# Patient Record
Sex: Female | Born: 1975 | Race: White | Hispanic: No | Marital: Married | Smoking: Former smoker
Health system: Western US, Academic
[De-identification: ages and names within clinical notes are randomized; demographics above are authoritative.]

## PROBLEM LIST (undated history)

## (undated) DIAGNOSIS — F32A Depression, unspecified: Secondary | ICD-10-CM

## (undated) DIAGNOSIS — F329 Major depressive disorder, single episode, unspecified: Secondary | ICD-10-CM

## (undated) DIAGNOSIS — G43909 Migraine, unspecified, not intractable, without status migrainosus: Secondary | ICD-10-CM

## (undated) DIAGNOSIS — O24419 Gestational diabetes mellitus in pregnancy, unspecified control: Secondary | ICD-10-CM

## (undated) HISTORY — DX: Major depressive disorder, single episode, unspecified: F32.9

## (undated) HISTORY — DX: Migraine, unspecified, not intractable, without status migrainosus: G43.909

## (undated) HISTORY — DX: Gestational diabetes mellitus in pregnancy, unspecified control: O24.419

## (undated) HISTORY — DX: Depression, unspecified: F32.A

## (undated) HISTORY — PX: EYE SURGERY: SHX253

---

## 2001-07-15 ENCOUNTER — Other Ambulatory Visit (INDEPENDENT_AMBULATORY_CARE_PROVIDER_SITE_OTHER): Payer: Self-pay | Admitting: Internal Medicine

## 2008-10-04 ENCOUNTER — Ambulatory Visit (HOSPITAL_BASED_OUTPATIENT_CLINIC_OR_DEPARTMENT_OTHER)

## 2008-10-04 ENCOUNTER — Encounter (HOSPITAL_BASED_OUTPATIENT_CLINIC_OR_DEPARTMENT_OTHER): Payer: Self-pay

## 2008-10-04 VITALS — Ht 65.0 in | Wt 223.0 lb

## 2008-10-04 MED ORDER — ONETOUCH ULTRASOFT LANCETS MISC
Status: DC
Start: 2008-10-04 — End: 2015-05-02

## 2008-10-04 MED ORDER — GLUCOSE BLOOD VI STRP
ORAL_STRIP | Freq: Four times a day (QID) | Status: DC
Start: 2008-10-04 — End: 2015-05-02

## 2008-10-04 MED ORDER — ONETOUCH ULTRA SYSTEM W/DEVICE KIT (CUSTOM)
1.0000 | PACK | Status: DC
Start: 2008-10-04 — End: 2015-05-02

## 2008-10-04 NOTE — Progress Notes (Signed)
 Autumn Parker is a 33 year old female G2P1001 at gestational age [redacted]w[redacted]d Estimated Date of Delivery: 12/10/08    Patient presents today for diabetes education referred from Dr Peggyann Juba.  Autumn Parker with history of New Dx GDM.     Assessment / Plan:    1. Diabetes: Autumn Parker was given overview of diabetes in pregnancy, causes, risks to mom and fetus, lifetime risks, treatment and goals. Given Sweet Success packet, 1800-2000 calorie GDM diet, exercise guidelines, food diary and blood sugar logbook.  Autumn Parker was given blood glucose meter with instruction and demonstration.  Return demonstration performed by patient.  Blood sugar result = 88 mg/dl. Patient given blood sugar goals and instructed to test blood sugars at fasting and 1 hour post breakfast, lunch and dinner and record in logbook.  Autumn Parker was instructed on when to report abnormal values and instructed to email/fax blood sugars weekly for review. RX for test strips and lancets given per [facility].     2. Routine OB/Fetus: Follow-up 1 week with RN for blood sugar review.    3. Education: Education: diabetes overview, pregnancy overview, maternal risks, fetal risks and breastfeeding  Nutrition: pregnancy nutrition, desired weight gain, meal distribution /  timing, meal composition, prenatal vitamins, labels, simple sugars and sweeteners/caffeine  Exercise:  type and frequency, timing and duration and complications: when / who to call  Blood glucose / urine monitoring:  blood glucose goals, meter instruction/return demo, testing frequency and how/when to report values  Psychosocial:  assessment performed-Edinburgh Depression scale assessed today-  Score 6    Fetal Surveillance: ultrasound, kick count method / record and NST/CST  Medication: type / action and dose / administration  Postpartum f/u:  2 hr PP GTT rationale/Risk Type 2, weight management, exercise and annual exam  Materials given:  sweet success  packet    Plan of care discussed with patient and patient verbalized understanding: yes.   All questions were answered at the end of the class.    Patient education session lasted 3 hr    Autumn Canter Qualin-Chappell, RN, CDE

## 2008-10-12 ENCOUNTER — Telehealth (HOSPITAL_BASED_OUTPATIENT_CLINIC_OR_DEPARTMENT_OTHER): Payer: Self-pay | Admitting: Registered Nurse

## 2008-10-12 NOTE — Telephone Encounter (Signed)
 Normal ranges: Fasting <90 Post meal <130  Times of Day Glucose Tested & Results    Aug 4 5 6 7 8 9 10    Fasting  (70-90)  82 83 82 89 90 80   After Bkfast  (90-130)  82 104 126 122 139 98   After Lunch  (90-130) 109 122 110 124 100 156 139   After Dinner  (90-130) 120 112 136 139 85 128 94   Bedtime  (90-130)          Early AM   (0200-0300)

## 2008-10-13 NOTE — Telephone Encounter (Signed)
 Amalee is a 33 year old female G2P1001 at gestational age [redacted]w[redacted]d Estimated Date of Delivery: 12/10/08    Comanaged with Dr Fritzi Mandes [facility]  Weight  223      Diabetes type:GDMa1    Chief Complaint   Patient presents with   . Diabetes     blood sugar review       Times of Day Glucose Tested & Results    Aug 4 5 6 7 8 9 10    Fasting  (70-90)  82 83 82 89 90 80   After Bkfast  (90-130)  82 104 126 122 139 bagel 98   After Lunch  (90-130) 109 122 110 124 100 156  Club  sam 139   After Dinner  (90-130) 120 112 136 b-day 139  sushi 85 128 94   Bedtime  (90-130)          Early AM   (0200-0300)            Assessment / Plan:    1. Diabetes: Doing well on diet control. Occasional sporadic elevation due to diet. Reinforced meal plan with pt and re-eval 1 week.     2. Routine OB/Fetus: Reports active fetus with FKC's <  20 minutes, U/S for Growth with PNA, symmetrically large and recommend repeat @ 37 weeks.   Will need NST/AFI @ 36 weeks.      3. Education discussed: Nutrition: meal distribution /  timing and meal composition  Exercise:  type and frequency, timing and duration and complications: when / who to call  Blood glucose / urine monitoring:  blood glucose goals, testing frequency and how/when to report values  Fetal Surveillance: ultrasound and kick count method / record    Plan of care discussed with patient and patient verbalized understanding: YES.  Kirkland Hun RN,CDE

## 2008-10-20 ENCOUNTER — Telehealth (HOSPITAL_BASED_OUTPATIENT_CLINIC_OR_DEPARTMENT_OTHER): Payer: Self-pay

## 2008-10-20 NOTE — Telephone Encounter (Signed)
 Autumn Parker is a 33 year old female G2P1001 at gestational age [redacted]w[redacted]d Estimated Date of Delivery: 12/10/08    Co-Manage Dr.Lee  Diabetes type:GDMa1  Weight 222    Chief Complaint   Patient presents with   . Diabetes     Review of blood sugars         Times of Day Glucose Tested & Results    Aug 11 12 13 14 15 16 17  Current Med Med Changes   Fasting 85 80 91 88 88 79 87 None None   After Bkfast  95 99 100 97 98 78     After Lunch 110 123 111   113 97 None None   After Dinner 89 128 114 97 89  110 None None   Bedtime        None None   Early AM   (0200-0300)              Assessment / Plan:    1. Diabetes: Pt doing well this week with blood sugars much improved after meals now that she has figured out diet better. Pt to continue with diet and blood sugar monitoring. Pt to send blood sugars again next week for review.    2. Routine OB/Fetus: Growth scan at [facility] @ 36 weeks, NST to start at 36 weeks, FKC's < 15 min    3. Education discussed: Nutrition: meal distribution /  timing and meal composition  Exercise:  type and frequency and timing and duration  Blood glucose / urine monitoring:  testing frequency and how/when to report values  Fetal Surveillance: ultrasound, kick count method / record and NST/CST    Plan of care discussed with patient and patient verbalized understanding: No, e-mailed pt plan.

## 2008-11-03 ENCOUNTER — Telehealth (HOSPITAL_BASED_OUTPATIENT_CLINIC_OR_DEPARTMENT_OTHER): Payer: Self-pay | Admitting: Registered Nurse

## 2008-11-03 NOTE — Telephone Encounter (Signed)
 Autumn Parker is a 33 year old female G2P1001 at gestational age [redacted]w[redacted]d Estimated Date of Delivery: 12/10/08    Comanaged with Dr Fritzi Mandes [facility]  Weight 223        Diabetes type:GDMa1    Chief Complaint   Patient presents with   . Diabetes     blood sugar review         Times of Day Glucose Tested & Results    Aug 25 26 27 28 29 30 31  Current Med Med Changes   Fasting 89 92 91 83 88 84 85 None None   After Bkfast 86 84 98 --- 113 85 115     After Lunch -- 101 124 --- --- 92 134 None None   After Dinner 120 138 spag 151  pizza 105 99 106 142 fruit tart None None   Bedtime        None None   Early AM   (0200-0300)              Assessment / Plan:    1. Diabetes: Doing  reasonablywell on diet control. Occasional sporadic elevation due to diet. Reinforced meal plan with pt and re-eval 1 week.    2. Routine OB/Fetus: Reports active fetus with FKC's <  15 minutes, U/S for Growth sched for 36 weeks at [facility].  Will need NST/AFI @ 36 weeks.      3. Education discussed: Nutrition: meal distribution /  timing, meal composition and simple sugars  Exercise:  type and frequency, timing and duration and complications: when / who to call  Blood glucose / urine monitoring:  blood glucose goals, testing frequency and how/when to report values  Fetal Surveillance: ultrasound, kick count method / record and NST/CST    Plan of care discussed with patient and patient verbalized understanding: YES.  Kirkland Hun RN,CDE

## 2008-11-10 ENCOUNTER — Telehealth (HOSPITAL_BASED_OUTPATIENT_CLINIC_OR_DEPARTMENT_OTHER): Payer: Self-pay | Admitting: Registered Nurse

## 2008-11-10 NOTE — Telephone Encounter (Signed)
 Autumn Parker is a 33 year old female G2P1001 at gestational age [redacted]w[redacted]d Estimated Date of Delivery: 12/10/08    Comanaged with Dr Fritzi Mandes [facility]  Weight 225.5        Diabetes type:GDMa1    Chief Complaint   Patient presents with   . Diabetes     blood sugar review         Times of Day Glucose Tested & Results    Sep  1 2 3 4 5 6 7  Current Med Med Changes   Fasting 81 78 86 87 86 83 78 None None   After Bkfast 121 111 101 102 --- 101 101     After Lunch --- 103 --- 134 125 128 131 None None   After Dinner 96 153 103 92 128 90 110 None None   Bedtime        None None   Early AM   (0200-0300)              Assessment / Plan:    1. Diabetes: Doing well on diet control. Continue current management. Review sugars 1 week. The patient was instructed to call PRN elevations or questions in the interim.        2. Routine OB/Fetus: Reports active fetus with FKC's <  15 minutes, U/S for Growth sched for 36 weeks at [facility].  Will need NST/AFI @ 36 weeks.      3. Education discussed: Nutrition: meal distribution /  timing and meal composition  Blood glucose / urine monitoring:  blood glucose goals, testing frequency and how/when to report values  Fetal Surveillance: ultrasound, kick count method / record and NST/CST    Plan of care discussed with patient and patient verbalized understanding: YES.  Kirkland Hun RN,CDE

## 2008-11-13 ENCOUNTER — Telehealth (HOSPITAL_BASED_OUTPATIENT_CLINIC_OR_DEPARTMENT_OTHER): Payer: Self-pay

## 2008-11-13 NOTE — Telephone Encounter (Deleted)
 Pt called and left VM stating that she had a question. Called her back at 11:20 am and left VM that we are returning her call.

## 2008-11-13 NOTE — Telephone Encounter (Signed)
 Encounter opened in error please disregard

## 2008-11-15 NOTE — Progress Notes (Signed)
GDM class

## 2008-11-17 ENCOUNTER — Telehealth (HOSPITAL_BASED_OUTPATIENT_CLINIC_OR_DEPARTMENT_OTHER): Payer: Self-pay | Admitting: Registered Nurse

## 2008-11-24 ENCOUNTER — Telehealth (HOSPITAL_BASED_OUTPATIENT_CLINIC_OR_DEPARTMENT_OTHER): Payer: Self-pay | Admitting: Registered Nurse

## 2010-11-08 ENCOUNTER — Encounter (INDEPENDENT_AMBULATORY_CARE_PROVIDER_SITE_OTHER): Payer: Self-pay

## 2011-01-21 ENCOUNTER — Encounter (INDEPENDENT_AMBULATORY_CARE_PROVIDER_SITE_OTHER): Payer: Self-pay | Admitting: Female Pelvic Medicine and Reconstructive Surgery

## 2011-01-21 ENCOUNTER — Ambulatory Visit (INDEPENDENT_AMBULATORY_CARE_PROVIDER_SITE_OTHER): Payer: Commercial Managed Care - PPO | Admitting: Female Pelvic Medicine and Reconstructive Surgery

## 2011-01-21 MED ORDER — ELETRIPTAN HYDROBROMIDE 40 MG OR TABS
40.00 mg | ORAL_TABLET | Freq: Once | ORAL | Status: DC | PRN
Start: ? — End: 2018-03-31

## 2011-01-21 MED ORDER — CITALOPRAM HYDROBROMIDE 20 MG OR TABS: 20.00 mg | ORAL_TABLET | Freq: Every day | ORAL | Status: AC

## 2011-01-21 MED ORDER — MULTIVITAMINS OR TABS
1.00 | ORAL_TABLET | Freq: Every day | ORAL | Status: DC
Start: ? — End: 2018-03-31

## 2011-01-21 MED ORDER — OMEGA-3 FATTY ACIDS 1000 MG OR CAPS
1.00 g | ORAL_CAPSULE | Freq: Every day | ORAL | Status: DC
Start: ? — End: 2019-01-13

## 2011-01-21 NOTE — Progress Notes (Deleted)
[facility]rogyn New Patient Progress Note    [facility] Consult  Date of Service: 01/21/2011  Referring Physician: Dr. Ocie Doyne      History of Present Illness:  This is a 35 year old G{NUMBERS 1 TO 5:11057} P{NUMBERS 1 TO 5:11057} patient who presents today as a new patient for evaluation of *** from Dr Ocie Doyne.   Her chief complaint is {URO SYMPTOMS:12510}. She reports today that these symptoms have been bothering her for {NUMBERS 1-12:10} {TIME FRAME:9076}. Secondary symptoms include {URO SYMPTOMS:12510}.      Pelvic Floor History    She reports being bothered by the following prolapse symptoms {UROGYN PROLAPSE:12505}.    In terms of bowel function, she is bothered by  {URO BOWEL SX:12506}.    With regard to urinary symptoms she is bothered by {URO URINARY SYMPTOMS:12508}    Specifically, in terms of incontinence symptomatology, she {URO DOES/DOES NOT COMPLAIN OF ZOXWRUE:45409}.  She {Uro bedwetting:12450}. She {URO/GYN DOES DOES NOT USE WJXB:14782}    She reports that she typically urinates {TYPICAL URINATION:14747}.  She reports typically {NUMBERS 0-10:320027} episodes of nocturia each night.      In regards to bladder sensation, she reports the following:  Dysuria?: {YES/NO:63}  Pain with filling?: {YES/NO:63}  Pain relieved with void?: {YES:13081}    She has *** bowel movements per week.  She {URO/GYN REPORTS/DENIES FECAL INCONTINENCE:14690}    Past Treatment  With regards to previous treatment for incontinence and/or prolapse, she reports the following: {HAS/HAS NOT tried kegel exercises:11502}    Medication: {URO MEDS YES/NO:12512}  Surgery(s)? {URO HAS HAS NOT SURGERIES:11505}  Pessary use? {URO PESSARY [id number]     Gynecologic History:   Patient is a G{NUMBERS 1 TO 5:11057} P{NUMBERS 1 TO 5:11057} with {NUMBERS 1 TO 5:11057} vaginal deliveries, {NUMBERS 1 TO 5:11057} Caesarian deliveries and {NUMBERS 1 TO 5:11057} miscarriages or abortions. The largest of which weighed {NUMBERS 1-10:11217}  pounds {NUMBERS TO FIFTEEN:11350} ounces. She is {URO PRE/POST MENOPAUSAL:12545}. She {URO SEXUALLY ACTIVE:11518}. She {REPORTS/DENIES:350210} that her sexual activities are limited by her pelvic floor condition(s). She feels that her sex life {IS/IS NFA:213086} satisfactory.    Other Relevant Medical History:  Past Surgical History   Procedure Date   . Cesarean delivery only       Past Medical History   Diagnosis Date   . Gestational diabetes    . Depression    . Migraines      Allergies: Sulfa drugs    Current Medications:  Current Outpatient Prescriptions on File Prior to Visit   Medication Sig Dispense Refill   . citalopram (CELEXA) 20 MG tablet Take 20 mg by mouth daily.       Marland Kitchen eletriptan (RELPAX) 40 MG tablet Take 40 mg by mouth once as needed. May repeat in 2 hours if necessary. Maximum of  2 capsules in 24 hours.       . Multiple Vitamin (MULTIVITAMIN) per tablet Take 1 tablet by mouth daily.       Marland Kitchen omega-3 fatty acids (OMEGA 3) 1000 MG capsule Take 1 g by mouth daily.       Letta Pate ULTRA SYSTEM (ONETOUCH ULTRA) W/DEVICE glucometer 1 Device by Other route As Directed. Use for home glucose monitoring  1 Kit  0   . glucose blood (ONE TOUCH ULTRA TEST) test strip by Other route 4 times daily (before meals and nightly). One Touch Ultra. Testing 4x daily    150 Strip  9   . ONETOUCH ULTRASOFT LANCETS (  ONETOUCH ULTRASOFT) lancet by Other route. Testing 4x daily  150 Lancet  9       Her family history includes Diabetes in her mother.    Social History:  She  reports that she has been passively smoking.  She has never used smokeless tobacco.   She  reports that she drinks alcohol.     Review of Systems:  Review of Systems  Constitutional: Negative  Eyes: Double vision;Blurred vision;Glasses/Contacts  ENT: Negative  Cardiac: Palpitations  Pulmonary: Negative  Gastrointestional: Negative  Musculoskeletal: Negative  Skin: Negative  Neurologic: Negative  Psychiatric: Depressed  Endocrine: Negative  Blood Disease:  Negative  Allergy: Negative    Quality of Life:  Pelvic Floor Distress Inventory short form PFDI-20 Score (out of 300): 108.33   Pelvic Organ Prolapse Distress Inventory POPDI-6 Score: 37.5  out of a possible 100  Colorectal-Anal Distress Inventory CRADI-8 Score: 0  out of a possible 100  Urinary Distress Inventory UDI-6 Score: 70.83  out of a possible 100    On the MESA questionnaire: Mesa Part I Score: 0.59  for stress symptoms and a Mesa Part II Score: 0.44  for urge symptoms.    The patient's global impression of her pelvic floor condition is "Check the one number that best describes how your urinary symptoms are now.: Moderate" .    Voiding Diary:{URO/GYN VOIDING DIARY COMPLETE/NOT COMPLETE:14692}    Physical Exam:  {URO EXAM, SYSTEM [id number]    Detailed Urogynecologic Evaluation:    Patient demonstrates a {NEGATIVE/POSITIVE:350231} standing stress test {w-w/o:125730} prolapse replaced with a bladder volume of *** mL.      She voided a volume of *** mL. She had a {NEGATIVE/POSITIVE:350231} supine {VALSALVA/COUGH:350229} stress test with a volume of *** mL.     Q-tip: {URO/GYN QTIP DONE/NOT ZOXW:96045}  POP-Q exam: {URO/GYN POPQ DONE/NOT WUJW:11914}    Sacral nerves:    Anal wink: {POSITIVE/NEGATIVE, SIMPLE:11064::"negative"}   Bulbocavernosus reflex: {POSITIVE/NEGATIVE, SIMPLE:11064::"negative"}   Sharp/dull descrimination on the perineum: {NORMAL/ABNORMAL:11606}  Levator muscles:    Tenderness: {YES/NO:63}   Resting tone: {LOW/MED/HIGH:350209}   Voluntary contractions: {NUMBERS 0-4:320406} out of 4  Pelvic:    External gentalia:{URO/GYN POP-Q EXT NWG:95621}   Urethra/bladder: {URO/GYN POP-Q URETHRA/BLADDER:14697}   Vaginal epithelium: {URO/GYN POP-Q HYQMVH:84696}   Cervix: {URO/GYN POP-Q EXBMWU:13244}   Uterus: {URO/GYN POP-Q UTERUS:14700}   Adnexae: {URO/GYN POP-Q ADNEXAE:14701}  Rectal:    Anal sphincter tone: {0-5:11279}/5 at rest and {0-5:11279}/5 with squeeze   Hemorrhoids:  {PRESENT/ABSENT:11491}   Masses: {PRESENT/ABSENT:11491}   Sphincter: {INTACT/DEFECT SUSPECTED:14779}      Procedures:   Urine dipstick: {URINE DIPSTICK RESULT:5374::"negative for all components"}  Uroflow:  Patient voided volume of *** mL in a {SURG NEURO VOID PATTERN:350202} pattern with a maximum flow rate of *** mL per second.   PVR of *** mL obtained by {CATHETERIZATION/BLADDER WNUU:725366}.    Assessment:  1. Mixed incontinence (788.33)    2. 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

## 2011-01-21 NOTE — Progress Notes (Signed)
[facility]rogyn New Patient Progress Note    [facility] Consult  Date of Service: 01/21/2011  Referring Physician: Dr. Ocie Doyne      History of Present Illness:  This is a 35 year old G2 P2 patient who presents today as a new patient for evaluation of pelvic organ prolapse  and Mixed Urinary Incontinence  from Dr Ocie Doyne.   Her chief complaint is mixed incontinence. More bothered by the stress component. She reports today that pelvic organ prolapse symptoms have been bothering her for 3 week(s) and Mixed Urinary Incontinence for the last 2 years.     Pelvic Floor History    She reports being bothered by the following prolapse symptoms heaviness.    In terms of bowel function, she is bothered by  none.    With regard to urinary symptoms she is bothered by urgency incontinence and stress incontinence    Specifically, in terms of incontinence symptomatology, she does complain of leaking during the daytime with leakage occuring 3 times per week(s) describing the leaking as moderate.  She denies nighttime bed wetting.. She does use pads for protection.  She reports using the following with regard to protection for urine loss: heavy pad when she has a cough. When she changes these pads they are usually wet.    She reports that she typically urinates every 2 hours.  She reports typically 0-1 episodes of nocturia each night.      In regards to bladder sensation, she reports the following:  Dysuria?: no  Pain with filling?: no  Pain relieved with void?: N/A    She has 7 bowel movements per week.  She denies fecal incontinence.     Past Treatment  With regards to previous treatment for incontinence and/or prolapse, she reports the following: has not tried Kegel or pelvic floor exercises for her symptoms    Medication: no  Surgery(s)? has not had surgery  Pessary use? Never     Gynecologic History:   1 vaginal deliveries (5.9 lbs), 1 Caesarian deliveries. She is sexually active at this time and those activities  do include vaginal intercourse. She denies pain with intercourse. She does not leak urine with intercourse . She denies that her sexual activities are limited by her pelvic floor condition(s). She feels that her sex life is satisfactory.    Other Relevant Medical History:  Past Surgical History   Procedure Date   . Cesarean delivery only       Past Medical History   Diagnosis Date   . Gestational diabetes    . Depression    . Migraines      Allergies: Sulfa drugs    Current Medications:  Current Outpatient Prescriptions on File Prior to Visit   Medication Sig Dispense Refill   . citalopram (CELEXA) 20 MG tablet Take 20 mg by mouth daily.       Marland Kitchen eletriptan (RELPAX) 40 MG tablet Take 40 mg by mouth once as needed. May repeat in 2 hours if necessary. Maximum of  2 capsules in 24 hours.       . Multiple Vitamin (MULTIVITAMIN) per tablet Take 1 tablet by mouth daily.       Marland Kitchen omega-3 fatty acids (OMEGA 3) 1000 MG capsule Take 1 g by mouth daily.       Letta Pate ULTRA SYSTEM (ONETOUCH ULTRA) W/DEVICE glucometer 1 Device by Other route As Directed. Use for home glucose monitoring  1 Kit  0   . glucose blood (  ONE TOUCH ULTRA TEST) test strip by Other route 4 times daily (before meals and nightly). One Touch Ultra. Testing 4x daily    150 Strip  9   . ONETOUCH ULTRASOFT LANCETS (ONETOUCH ULTRASOFT) lancet by Other route. Testing 4x daily  150 Lancet  9       Her family history includes Diabetes in her mother.    Social History:  She  reports that she has been passively smoking.  She has never used smokeless tobacco.   She  reports that she drinks alcohol.     Review of Systems:  Review of Systems  Constitutional: Negative  Eyes: Double vision;Blurred vision;Glasses/Contacts  ENT: Negative  Cardiac: Palpitations  Pulmonary: Negative  Gastrointestional: Negative  Musculoskeletal: Negative  Skin: Negative  Neurologic: Negative  Psychiatric: Depressed  Endocrine: Negative  Blood Disease: Negative  Allergy: Negative    Quality  of Life:  Pelvic Floor Distress Inventory short form PFDI-20 Score (out of 300): 108.33   Pelvic Organ Prolapse Distress Inventory POPDI-6 Score: 37.5  out of a possible 100  Colorectal-Anal Distress Inventory CRADI-8 Score: 0  out of a possible 100  Urinary Distress Inventory UDI-6 Score: 70.83  out of a possible 100    On the MESA questionnaire: Mesa Part I Score: 0.59  for stress symptoms and a Mesa Part II Score: 0.44  for urge symptoms.    The patient's global impression of her pelvic floor condition is "Check the one number that best describes how your urinary symptoms are now.: Moderate" .    Voiding Diary:Not completed by patient.     Physical Exam:  VITALS: BP 114/73  Pulse 77  Temp(Src) 98 F (36.7 C) (Oral)  Ht 5\' 5"  (1.651 m)  Wt 102.059 kg (225 lb)  BMI 37.44 kg/m2  Breastfeeding? Unknown  GENERAL: She is alert and oriented x3, in a pleasant mood.   SKIN: warm, dry and intact.   ABDOMEN: + pannus, Soft, nontender, nondistended  rebound, guarding, tenderness or hernias.  EXTREMITIES: No clubbing, cyanosis or edema.   NEUROLOGIC: She has normal sharp and dull discrimination of lower extremities bilaterally. Deep tendon reflexes and motor strength are symmetric and normal bilaterally.     Detailed Urogynecologic Evaluation:    Patient demonstrates a positive  standing stress test without prolapse replaced with a bladder volume of 450 mL.      She had a negative supine cough stress test.     Q-tip: 8 degrees at rest, 35 degrees with strain.  POP-Q exam:   Aa  0 Ba  0 (default of Aa) C  -5.5   Gh  3/4.5 pb  4/4.5 tvl  9   Ap  -0.5 Bp  0 D  -7         Sacral nerves:    Anal wink: positive   Bulbocavernosus reflex: positive   Sharp/dull descrimination on the perineum: normal  Levator muscles:    Tenderness: no   Resting tone: normal   Voluntary contractions: 3 out of 4 but can not sustain  Pelvic:    External gentalia:normal bartholins and skenes   Urethra/bladder: normal   Vaginal epithelium:  normal   Cervix: normal without lesions   Uterus: normal size, shape, contour   Adnexae: limited by habitus    Procedures:   Urine dipstick: negative for all components  Uroflow:  Patient voided volume of 425 mL in a continuous, smooth pattern with a maximum flow rate of 61 mL per second.  PVR of 10 mL obtained by catheterization.    Assessment:  1. Mixed incontinence (788.33)    2. 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  For the stress component, conservative measures were reviewed with the patient today including: Kegel Exercises with and without pelvic floor physical therapy , a pessary, Urethral Bulking Injection , and surgery. Discussed that best to defer definitive treatment once patient reaches steady weight and we know that she is symptomatic once weight loss is completed.   patient agreed to try pessary: #3 r/k fitted, comfortable, did not expel, taught to remove/replace. Will try #4 if still symptomatic     For the urge component, reviewed Kegels, behavioral modifications. patient will fill out a voiding diary before and after implementation of behavioral modifications.    Stage II pelvic organ prolapse, not bothersome to the patient.    Weight loss discussed, including [email or web address]    Noah Delaine, M.D, M.S.  [facility]  01/21/2011  5:12 PM

## 2011-02-18 ENCOUNTER — Encounter (INDEPENDENT_AMBULATORY_CARE_PROVIDER_SITE_OTHER): Payer: Commercial Managed Care - PPO | Admitting: Female Pelvic Medicine and Reconstructive Surgery

## 2011-03-18 ENCOUNTER — Encounter (INDEPENDENT_AMBULATORY_CARE_PROVIDER_SITE_OTHER): Payer: Commercial Managed Care - PPO | Admitting: Female Pelvic Medicine and Reconstructive Surgery

## 2011-04-08 ENCOUNTER — Ambulatory Visit (INDEPENDENT_AMBULATORY_CARE_PROVIDER_SITE_OTHER): Payer: Commercial Managed Care - PPO | Admitting: Female Pelvic Medicine and Reconstructive Surgery

## 2011-04-08 MED ORDER — TRIAMTERENE 100 MG OR CAPS
100.00 mg | ORAL_CAPSULE | Freq: Once | ORAL | Status: DC
Start: ? — End: 2015-05-02

## 2013-04-10 ENCOUNTER — Encounter (INDEPENDENT_AMBULATORY_CARE_PROVIDER_SITE_OTHER): Payer: Self-pay | Admitting: Female Pelvic Medicine and Reconstructive Surgery

## 2015-04-04 ENCOUNTER — Encounter (INDEPENDENT_AMBULATORY_CARE_PROVIDER_SITE_OTHER): Payer: Self-pay | Admitting: Neurology

## 2015-04-04 ENCOUNTER — Ambulatory Visit (INDEPENDENT_AMBULATORY_CARE_PROVIDER_SITE_OTHER): Admitting: Neurology

## 2015-04-04 DIAGNOSIS — G932 Benign intracranial hypertension: Principal | ICD-10-CM

## 2015-04-04 MED ORDER — ACETAZOLAMIDE 250 MG OR TABS
ORAL_TABLET | ORAL | 11 refills | Status: DC
Start: 2015-04-04 — End: 2015-05-03

## 2015-04-04 NOTE — Progress Notes (Signed)
This 40 year old woman is seen in neuro ophthalmology follow-up for idiopathic intracranial hypertension.  The patient was first seen in July 2015 with swelling left optic nerve.  We determined that she had idiopathic intracranial hypertension based on a normal MRI with MR angiography, and a slightly elevated opening pressure of into an 65.  When last seen in April 2016, she was having occasional mild headaches, and her optic nerve showed elevations of both optic nerves more than the left than the right with minimal swelling and normal visual fields and visual acuity.  She was supposed to follow up in 2 months but never did so.    Over the past year the patient's had a lot of stress in her life and has not been taking good care of herself.  She's put on an additional 10 pounds or more.  She continues to have headaches once a month with menses.  She does now report that she's been having transient visual obscurations which were gone when I last saw her.  When she stands from a seated position her vision darkens.  She denies any pulsatile tinnitus.    Patient was seen by Dr. 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  She was asked to see me.    There's been no change in her history otherwise since last seen.      Current Outpatient Prescriptions:   .  acetaZOLAMIDE (DIAMOX) 250 MG tablet, 1 twice a day for 3 days, one in the morning and 2 at night for 3 days, then 2 twice a day., Disp: 120 tablet, Rfl: 11  .  citalopram (CELEXA) 20 MG tablet, Take 20 mg by mouth daily., Disp: , Rfl:   .  eletriptan (RELPAX) 40 MG tablet, Take 40 mg by mouth once as needed. May repeat in 2 hours if necessary. Maximum of  2 capsules in 24 hours., Disp: , Rfl:   .  glucose blood (ONE TOUCH ULTRA TEST) test strip, by Other route 4 times daily (before meals and nightly). One Touch Ultra. Testing 4x daily , Disp: 150 Strip, Rfl: 9  .  Multiple Vitamin (MULTIVITAMIN) per tablet, Take 1 tablet by  mouth daily., Disp: , Rfl:   .  omega-3 fatty acids (OMEGA 3) 1000 MG capsule, Take 1 g by mouth daily., Disp: , Rfl:   .  ONETOUCH ULTRA SYSTEM (ONETOUCH ULTRA) W/DEVICE glucometer, 1 Device by Other route As Directed. Use for home glucose monitoring, Disp: 1 Kit, Rfl: 0  .  ONETOUCH ULTRASOFT LANCETS (ONETOUCH ULTRASOFT) lancet, by Other route. Testing 4x daily, Disp: 150 Lancet, Rfl: 9  .  triamterene (DYRENIUM) 100 MG capsule, Take 100 mg by mouth once., Disp: , Rfl:       Allergies   Allergen Reactions   . Sulfa Drugs Unspecified         Examination: Visual acuity is 20/15 -1 on the right eye and 20/20 -1+1 in the left eye at distance without glasses.  The pupils are 4 mm and briskly reactive that there is a trace afferent defect on the left.  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  The left eye shows significant large of the blind spot with some nasal depression and a mean deviation of -5.0 where previously she was -1.7.  Dilated funduscopic examination shows moderate papilledema on the  right and moderate to marked papilledema on the left.    Remainder the neurologic examination is nonfocal    LMP  (LMP Unknown)      Impression idiopathic and current hypertension:    This patient who failed follow-up, his put on weight, and her intracranial hypertension has substantially worsened.  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  I'm starting her on Diamox 250 mg twice a day with an increase to 500 mg twice a day.  She has an allergy to sulfa eyedrops, which occasionally can co-react with Diamox so she is aware to go to the emergency room if there are any significant allergic consequences after she takes the Diamox.  We discussed the importance of weight loss and she's working on that as well.  I've asked to see her back in 3 weeks, if she is having worsening  vision or other issues we'll see her sooner.  I've made her aware of the importance of follow-up and weight loss and the risk of vision loss in this condition if not aggressively treated.

## 2015-04-30 ENCOUNTER — Encounter (INDEPENDENT_AMBULATORY_CARE_PROVIDER_SITE_OTHER)

## 2015-05-02 ENCOUNTER — Ambulatory Visit (INDEPENDENT_AMBULATORY_CARE_PROVIDER_SITE_OTHER): Admitting: Neurology

## 2015-05-02 ENCOUNTER — Encounter (INDEPENDENT_AMBULATORY_CARE_PROVIDER_SITE_OTHER): Payer: Self-pay | Admitting: Neurology

## 2015-05-02 ENCOUNTER — Encounter (INDEPENDENT_AMBULATORY_CARE_PROVIDER_SITE_OTHER)

## 2015-05-02 DIAGNOSIS — G932 Benign intracranial hypertension: Principal | ICD-10-CM

## 2015-05-02 MED ORDER — ACETAZOLAMIDE 250 MG OR TABS
750.0000 mg | ORAL_TABLET | Freq: Two times a day (BID) | ORAL | 11 refills | Status: DC
Start: 2015-05-02 — End: 2015-05-03

## 2015-05-02 NOTE — Interdisciplinary (Signed)
 Autumn Parker, have reviewed medications and allergies with patient.

## 2015-05-02 NOTE — Progress Notes (Signed)
This patient with idiopathic intracranial hypertension diagnosed in July 2015 with normal MRI and elevated opening pressure of 265 mm of CSF is seen for a one-month follow-up after she's had recurrent symptoms after failing to f/u for over a year and had significant papilledema with some visual field changes.     Since last seen she's lost 7 pounds, is taking her Diamox which she does not like, and feels that her vision is improved.  The transient visual obscurations of gone away, and she's having no significant headaches.  She feels her vision is slightly better.      Her overall mood is also improved.  She's having some side effects of Diamox that they're much better now than when she first started.  She is willing to increase the dose.  Current Outpatient Prescriptions:   .  acetaZOLAMIDE (DIAMOX) 250 MG tablet, 1 twice a day for 3 days, one in the morning and 2 at night for 3 days, then 2 twice a day., Disp: 120 tablet, Rfl: 11  .  citalopram (CELEXA) 20 MG tablet, Take 20 mg by mouth daily., Disp: , Rfl:   .  eletriptan (RELPAX) 40 MG tablet, Take 40 mg by mouth once as needed. May repeat in 2 hours if necessary. Maximum of  2 capsules in 24 hours., Disp: , Rfl:   .  Multiple Vitamin (MULTIVITAMIN) per tablet, Take 1 tablet by mouth daily., Disp: , Rfl:   .  omega-3 fatty acids (OMEGA 3) 1000 MG capsule, Take 1 g by mouth daily., Disp: , Rfl:         Examination:    Visual acuity is 20/20 +3 in the right eye and 20/20 -1+2 in the left eye at distance with contacts  Pupils are 5 mm and briskly reactive no afferent defect  I did not dilate her, and I couldn't get a great view of the disc but they appear mildly swollen.  [facility] today shows normal field on the right except for some very subtle depression and mean deviation of -0.9 which is a significant improvement.  Left eye still shows an enlarged blind spot with inferior nasal step is improved with mean deviation of -4.5.  Eye movements are  full  Patient is awake alert and oriented  Gait is normal    BP 110/76  Pulse 71  Ht 5\' 5"  (1.651 m)  Wt 112.5 kg (248 lb)  LMP 04/23/2015 (Exact Date)  BMI 41.27 kg/m2        Impression: Benign intracranial hypertension    This patient has benign intracranial hypertension that went untreated for over a year which time she had significant worsening in her papilledema or visual field changes.  Over the last 4 weeks she has begun turning things around with weight loss, starting Diamox, and her examination shows evidence of improvement.  As long as she is stable we will recheck her in 4 weeks and we will increase the Diamox to 750 mg twice a day as tolerated.    Electronically signed by: Maxwell Caul. Fara Olden, MD   Signature Derived From Controlled Access Password, May 02, 2015, 11:02 AM

## 2015-05-03 ENCOUNTER — Other Ambulatory Visit (INDEPENDENT_AMBULATORY_CARE_PROVIDER_SITE_OTHER): Payer: Self-pay | Admitting: Neurology

## 2015-05-03 DIAGNOSIS — G932 Benign intracranial hypertension: Principal | ICD-10-CM

## 2015-05-03 MED ORDER — ACETAZOLAMIDE 250 MG OR TABS
750.0000 mg | ORAL_TABLET | Freq: Two times a day (BID) | ORAL | 11 refills | Status: DC
Start: 2015-05-03 — End: 2015-10-02

## 2015-05-03 NOTE — Telephone Encounter (Signed)
05/03/15  Pt called to say Dr. Fara Olden incrased her Diamox yesterday, however it is more expensive at [facility] than her [facility] and asked for rx to [facility] // eRx'd //  dec

## 2015-05-30 ENCOUNTER — Encounter (INDEPENDENT_AMBULATORY_CARE_PROVIDER_SITE_OTHER): Payer: Self-pay | Admitting: Neurology

## 2015-05-30 ENCOUNTER — Encounter (INDEPENDENT_AMBULATORY_CARE_PROVIDER_SITE_OTHER)

## 2015-05-30 ENCOUNTER — Ambulatory Visit (INDEPENDENT_AMBULATORY_CARE_PROVIDER_SITE_OTHER): Admitting: Neurology

## 2015-05-30 DIAGNOSIS — G932 Benign intracranial hypertension: Principal | ICD-10-CM

## 2015-05-30 NOTE — Progress Notes (Signed)
This 40 year old woman with a history of idiopathic intracranial hypertension was in remission until she dropped out of care and gained weight.  When seen on 1 February of this year she had increased papilledema and visual symptoms.  When seen again on 1 March she lost 7 pounds, and her visual symptoms were much better.  Her visual fields also improved.  Has lost 4 more pounds (11 total). Feeling good. Unable to tolerate more than 1000mg  of Diamox. Feels too tired. No headaches, occ pulsatile tinnitus, no TVOs.       Current Outpatient Prescriptions:   .  acetaZOLAMIDE (DIAMOX) 250 MG tablet, Take 3 tablets (750 mg) by mouth 2 times daily., Disp: 180 tablet, Rfl: 11  .  citalopram (CELEXA) 20 MG tablet, Take 20 mg by mouth daily., Disp: , Rfl:   .  eletriptan (RELPAX) 40 MG tablet, Take 40 mg by mouth once as needed. May repeat in 2 hours if necessary. Maximum of  2 capsules in 24 hours., Disp: , Rfl:   .  Multiple Vitamin (MULTIVITAMIN) per tablet, Take 1 tablet by mouth daily., Disp: , Rfl:   .  omega-3 fatty acids (OMEGA 3) 1000 MG capsule, Take 1 g by mouth daily., Disp: , Rfl:       Examination:    The patient is awake alert and oriented  The eye movements are full  No afferent defect is noted  Visual fields are full to confrontation  [facility] 30-2 threshold visual field testing shows a mildly enlarged blind spot on the right but is otherwise normal with a mean deviation of -0.7.  The left eye shows an enlarged blind spot and inferior nasal defect and a mean deviation -4.19 which is a mild improvement from last visit.  Dilated funduscopic examination shows Minimal if any edema on the right, but the left still is moderately swollen.  No hemorrhages noted.      Ht 5\' 5"  (1.651 m)  Wt 108.9 kg (240 lb)  LMP 04/23/2015 (LMP Unknown)  BMI 39.94 kg/m2      Impression: Idiopathic intracranial hypertension    This patient has had recurrence of her increased intracranial pressure secondary to weight gain.   She's now lost 11 pounds, her fields are improving and her symptoms are improving.  She will try again to try and increase the Diamox to 1500 mg a day by using it 3 times a day and senna twice a day.  As long as she is stable I will recheck her in 6 weeks.  I'm very pleased with her progress.    Electronically signed by: Maxwell Caul. Fara Olden, MD   Signature Derived From Controlled Access Password, May 30, 2015, 12:00 PM

## 2015-07-12 ENCOUNTER — Encounter (INDEPENDENT_AMBULATORY_CARE_PROVIDER_SITE_OTHER): Payer: Self-pay | Admitting: Neurology

## 2015-07-12 ENCOUNTER — Ambulatory Visit (INDEPENDENT_AMBULATORY_CARE_PROVIDER_SITE_OTHER): Admitting: Neurology

## 2015-07-12 VITALS — BP 117/79 | HR 80

## 2015-07-12 DIAGNOSIS — G932 Benign intracranial hypertension: Principal | ICD-10-CM

## 2015-07-12 NOTE — Interdisciplinary (Signed)
 I, Jilda Panda have reviewed allergies and medications with this patient.

## 2015-07-12 NOTE — Progress Notes (Signed)
This 40 year old woman with a history of idiopathic intracranial hypertension was in remission until she dropped out of care and gained weight.  She is now tolerating 750 mg of Diamox twice a day.When seen on 1 February of this year she had increased papilledema and visual symptoms.   Has lost 5 more pounds (16 total). Feeling good.  Allergies are bothering her.  No headaches, occ pulsatile tinnitus, no TVOs. One migraine. Overall she's feeling great.      Current Outpatient Prescriptions:   .  acetaZOLAMIDE (DIAMOX) 250 MG tablet, Take 3 tablets (750 mg) by mouth 2 times daily., Disp: 180 tablet, Rfl: 11  .  citalopram (CELEXA) 20 MG tablet, Take 20 mg by mouth daily., Disp: , Rfl:   .  eletriptan (RELPAX) 40 MG tablet, Take 40 mg by mouth once as needed. May repeat in 2 hours if necessary. Maximum of  2 capsules in 24 hours., Disp: , Rfl:   .  Multiple Vitamin (MULTIVITAMIN) per tablet, Take 1 tablet by mouth daily., Disp: , Rfl:   .  omega-3 fatty acids (OMEGA 3) 1000 MG capsule, Take 1 g by mouth daily., Disp: , Rfl:       Examination:    The patient is awake alert and oriented  The eye movements are full  Pupils 4.5/4,No afferent defect is noted  Visual fields are full to confrontation  [facility] 30-2 threshold visual field testing shows a mildly normal blind spot on the right but is otherwise normal with a mean deviation of -0.21.  The blind spot is enlarged, but clearly smaller than the last time, and the inferior nasal depression is gone.  Her mean deviation is gone from -4.2 to -1.1.  There was high false positive error rate. Dilated funduscopic examination shows minimal edema on both sides.  No hemorrhages are noted.  Is unclear if there is improvement, there is certainly no worsening.  BP 117/79  Pulse 80      Impression: Idiopathic intracranial hypertension    This patient has had recurrence of her increased intracranial pressure secondary to weight gain.  She's now lost 16 pounds,And she no  longer is symptomatic, and her visual fields are nearly normal.  She had significant inferior nasal nerve fiber bundle defects that have improved significantly.  She'll stand 1500 mg of Diamox which she's now tolerating.  I will see her back in 6 weeks.  She will continue her controlled weight loss.      Electronically signed by: Maxwell Caul. Fara Olden, MD   Signature Derived From Controlled Access Password, Jul 12, 2015, 12:00 PM

## 2015-08-21 ENCOUNTER — Ambulatory Visit (INDEPENDENT_AMBULATORY_CARE_PROVIDER_SITE_OTHER): Admitting: Neurology

## 2015-08-21 DIAGNOSIS — G932 Benign intracranial hypertension: Principal | ICD-10-CM

## 2015-08-21 NOTE — Interdisciplinary (Signed)
 db

## 2015-08-21 NOTE — Progress Notes (Signed)
This patient with recurrent idiopathic intracranial hypertension from weight gain, initially seen 5 months ago, has had progressive overall improvement in association with a 15 pound weight loss.   She notes no transient visual operations, no blurred vision, and minimal occasional headaches.  She is taking 1500 mg of acetazolamide today.  She is tolerating it well.      Current Outpatient Prescriptions:   .  acetaZOLAMIDE (DIAMOX) 250 MG tablet, Take 3 tablets (750 mg) by mouth 2 times daily., Disp: 180 tablet, Rfl: 11  .  citalopram (CELEXA) 20 MG tablet, Take 20 mg by mouth daily., Disp: , Rfl:   .  eletriptan (RELPAX) 40 MG tablet, Take 40 mg by mouth once as needed. May repeat in 2 hours if necessary. Maximum of  2 capsules in 24 hours., Disp: , Rfl:   .  Multiple Vitamin (MULTIVITAMIN) per tablet, Take 1 tablet by mouth daily., Disp: , Rfl:   .  omega-3 fatty acids (OMEGA 3) 1000 MG capsule, Take 1 g by mouth daily., Disp: , Rfl:     Allergies   Allergen Reactions   . Sulfa Drugs Unspecified, Other and Swelling     CONV. REACTION:Swelling   . Ioversol Unspecified         Examination:    Visual acuity is 20/15 -1 of the right eye and 2015-1 in the left eye at distance with contact  Eye movements are full visual fields are full to confrontation [employer] thresher visual field testing is normal in the right eye separate from mild increased the blind spot enemy deviation -0.7.  The left eye shows inferior nasal depression with a normal blind spot admitting deviation of -2.3 which is stable.  Dilated funduscopic examination continues to show very mild papilledema on the right, and mild to moderate on the left.    BP 112/75  Pulse 69  Ht 5\' 5"  (1.651 m)  Wt 108.9 kg (240 lb)  BMI 39.94 kg/m2      Impression: Intracranial hypertension      Autumn Parker is doing well overall.  His long as she continues to lose weight we should be able to back in remission.  I reduced her acetazolamide to 1 g a day, and we will  recheck her in 6 weeks.  She will continue her weight loss program.      Electronically signed by: Maxwell Caul. Fara Olden, MD   Signature Derived From Controlled Access Password, August 21, 2015, 11:50 AM

## 2015-10-02 ENCOUNTER — Ambulatory Visit (INDEPENDENT_AMBULATORY_CARE_PROVIDER_SITE_OTHER): Admitting: Neurology

## 2015-10-02 ENCOUNTER — Encounter (INDEPENDENT_AMBULATORY_CARE_PROVIDER_SITE_OTHER): Payer: Self-pay | Admitting: Neurology

## 2015-10-02 VITALS — BP 125/85 | HR 87 | Ht 65.0 in | Wt 239.0 lb

## 2015-10-02 DIAGNOSIS — G932 Benign intracranial hypertension: Principal | ICD-10-CM

## 2015-10-02 MED ORDER — FLUTICASONE PROPIONATE 50 MCG/ACT NA SUSP
2.00 | NASAL | Status: DC
Start: 2015-09-22 — End: 2017-04-01

## 2015-10-02 MED ORDER — FEXOFENADINE-PSEUDOEPHEDRINE 180-240 MG OR TB24
1.00 | ORAL_TABLET | ORAL | Status: DC
Start: 2015-09-22 — End: 2017-04-01

## 2015-10-02 NOTE — Progress Notes (Signed)
This patient with recurrent idiopathic intracranial hypertension from weight gain, initially seen 5 months ago, has had progressive overall improvement in association with a 13 pound weight loss (gained 2 on vacation)  She notes no transient visual operations, no blurred vision, and minimal occasional headaches. She is taking 1000 mg of acetazolamide today (down from 1500). She is tolerating it well.    No headaches, TVOs      Current Outpatient Prescriptions:   .  citalopram (CELEXA) 20 MG tablet, Take 20 mg by mouth daily., Disp: , Rfl:   .  eletriptan (RELPAX) 40 MG tablet, Take 40 mg by mouth once as needed. May repeat in 2 hours if necessary. Maximum of  2 capsules in 24 hours., Disp: , Rfl:   .  fexofenadine-pseudoephedrine (ALLEGRA-D 24) 180-240 MG tablet, Take 1 tablet by mouth., Disp: , Rfl:   .  fluticasone propionate (FLONASE) 50 MCG/ACT nasal spray, Spray 2 sprays into each nostril., Disp: , Rfl:   .  Multiple Vitamin (MULTIVITAMIN) per tablet, Take 1 tablet by mouth daily., Disp: , Rfl:   .  omega-3 fatty acids (OMEGA 3) 1000 MG capsule, Take 1 g by mouth daily., Disp: , Rfl:     Allergies   Allergen Reactions   . Sulfa Drugs Unspecified, Other and Swelling     CONV. REACTION:Swelling   . Ioversol Unspecified       VA: 20/20-2+3 OD and 20/15-2 OS  Full EOM  CVF: Mild increase BS OD with MD -1.01 (improved) and mild left nasals step with MD with MD -2.09 (improved)  Dilated funduscopic examination still shows some mild edema on the right, and mild-to-moderate on the left.  BP 125/85 (BP cuff site: Left;Upper;Arm, BP Patient Position: Sitting, BP cuff size: Large)  Pulse 87  Ht 5\' 5"  (1.651 m)  Wt 108.4 kg (239 lb)  BMI 39.77 kg/m2    Impression idiopathic intracranial hypertension    The patient is relatively stable.  She is no longer symptomatic, her visual function continues to improve, but her papilledema continues to be present.  She understands that it will not go away until she loses more  weight.  I've encouraged her to continue the weight loss program, and I will see her back in 6 weeks.    Electronically signed by: Maxwell Caul. Fara Olden, MD   Signature Derived From Controlled Access Password, October 02, 2015, 4:09 PM

## 2015-10-02 NOTE — Interdisciplinary (Signed)
I Autumn Parker have reviewed Medications and Allergies with patient.

## 2015-11-14 ENCOUNTER — Ambulatory Visit (INDEPENDENT_AMBULATORY_CARE_PROVIDER_SITE_OTHER): Admitting: Neurology

## 2015-11-14 ENCOUNTER — Encounter (INDEPENDENT_AMBULATORY_CARE_PROVIDER_SITE_OTHER): Payer: Self-pay | Admitting: Neurology

## 2015-11-14 ENCOUNTER — Encounter (INDEPENDENT_AMBULATORY_CARE_PROVIDER_SITE_OTHER)

## 2015-11-14 VITALS — BP 128/84 | HR 75 | Ht 65.0 in | Wt 239.0 lb

## 2015-11-14 DIAGNOSIS — G932 Benign intracranial hypertension: Principal | ICD-10-CM

## 2015-11-14 MED ORDER — ACETAZOLAMIDE 250 MG OR TABS
ORAL_TABLET | ORAL | Status: DC
Start: 2015-11-01 — End: 2016-06-19

## 2015-11-14 NOTE — Progress Notes (Signed)
This patient with recurrent idiopathic intracranial hypertension from weight gain, initially seen 7 months ago, has had progressive overall improvement in association with a 16 pound weight loss   She notes no transient visual operations, no blurred vision, and minimal occasional typical migraine headaches. She is taking 1000 mg of acetazolamideShe is tolerating it OK. Does not like the perverse taste.       Current Outpatient Prescriptions:   .  acetaZOLAMIDE (DIAMOX) 250 MG tablet, , Disp: , Rfl:   .  citalopram (CELEXA) 20 MG tablet, Take 20 mg by mouth daily., Disp: , Rfl:   .  eletriptan (RELPAX) 40 MG tablet, Take 40 mg by mouth once as needed. May repeat in 2 hours if necessary. Maximum of  2 capsules in 24 hours., Disp: , Rfl:   .  fexofenadine-pseudoephedrine (ALLEGRA-D 24) 180-240 MG tablet, Take 1 tablet by mouth., Disp: , Rfl:   .  fluticasone propionate (FLONASE) 50 MCG/ACT nasal spray, Spray 2 sprays into each nostril., Disp: , Rfl:   .  Multiple Vitamin (MULTIVITAMIN) per tablet, Take 1 tablet by mouth daily., Disp: , Rfl:   .  omega-3 fatty acids (OMEGA 3) 1000 MG capsule, Take 1 g by mouth daily., Disp: , Rfl:     Allergies   Allergen Reactions   . Sulfa Drugs Unspecified, Other and Swelling     CONV. REACTION:Swelling   . Ioversol Unspecified       VA: 20/215- 3 OD and 20/15-1 OS  Full EOM  CVF: Mild increase BS OD with MD -1.08 (stable) and mild left nasals step with MD with MD -1.77 (improved)  Dilated funduscopic examination still shows some mild edema on the right, and mild-to-moderate on the left.  BP 128/84 (BP cuff site: Left;Upper;Arm, BP Patient Position: Sitting, BP cuff size: Regular)  Pulse 75  Ht 5\' 5"  (1.651 m)  Wt 108.4 kg (239 lb)  LMP  (LMP Unknown)  BMI 39.77 kg/m2      Impression idiopathic intracranial hypertension:    The patient is now asymptomatic.  Her fields are normal on the right, and the left shows some minimal depression which is improving.  Her weight continues to  go down slowly and she is working at it.  She still has mild disc swelling, but I think it's improving.  As long as she is stable I will recheck her in 8 weeks.      Electronically signed by: Maxwell Caul. Fara Olden, MD   Signature Derived From Controlled Access Password, November 14, 2015, 4:09 PM

## 2016-01-15 ENCOUNTER — Encounter (INDEPENDENT_AMBULATORY_CARE_PROVIDER_SITE_OTHER)

## 2016-01-15 ENCOUNTER — Encounter (INDEPENDENT_AMBULATORY_CARE_PROVIDER_SITE_OTHER): Admitting: Neurology

## 2016-02-01 ENCOUNTER — Encounter (INDEPENDENT_AMBULATORY_CARE_PROVIDER_SITE_OTHER)

## 2016-02-01 ENCOUNTER — Encounter (INDEPENDENT_AMBULATORY_CARE_PROVIDER_SITE_OTHER): Admitting: Neurology

## 2016-02-27 ENCOUNTER — Encounter (INDEPENDENT_AMBULATORY_CARE_PROVIDER_SITE_OTHER): Payer: Self-pay | Admitting: Neurology

## 2016-02-27 ENCOUNTER — Encounter (INDEPENDENT_AMBULATORY_CARE_PROVIDER_SITE_OTHER)

## 2016-02-27 ENCOUNTER — Ambulatory Visit (INDEPENDENT_AMBULATORY_CARE_PROVIDER_SITE_OTHER): Admitting: Neurology

## 2016-02-27 DIAGNOSIS — G932 Benign intracranial hypertension: Principal | ICD-10-CM

## 2016-02-27 NOTE — Progress Notes (Signed)
This is a three-month follow-up for this patient with recurrent idiopathic intracranial hypertension who lost weight and when last seen was back in remission with mild disc swelling and normal fields and no symptoms.  Due to the persistence of disc swelling, I kept her on Diamox.  She is 6 weeks late for her visit.      Due to along potation, she put on about 10 pounds.  She still remains asymptomatic.  She notes no visual obscurations or headaches.  She's taking her Diamox faithfully.    Examination:    Visual acuity is 20/15 -1 and the right eye and 20/15-1 in the left eye at distance with contacts  Eye movements are full  Pupils are 5 mm and briskly reactive with no afferent defect    [facility] 30-2 threshold visual field testing is normal in the right eye with a mean deviation of -1.04, and the left eye still shows some inferior nasal depression with a mean deviation of -2.01.  Patient did have a lot of false positive errors. These are stable    Dilated funduscopic examination shows Mild  bilaterall disc swelling y setting on the left. This appears unchanged.    BP 134/78 (BP Location: Left arm, BP Patient Position: Sitting, BP cuff size: Large)  Pulse 74  Ht 5\' 5"  (1.651 m)  Wt 112.5 kg (248 lb)  LMP  (LMP Unknown)  BMI 41.27 kg/m2      Impression:  Idiopathic intercranial hypertension    The patient's vision is stable, but she is put on 10 pounds since her last visit.  She will make a strong effort to continue to lose weight.  I will see her back in 8 weeks.  I've indicated the importance of following up on time.  He will continue her Diamox.    Electronically signed by: Zara Chess, MD   Signature Derived From Controlled Access Password, February 27, 2016, 4:24 PM

## 2016-04-30 ENCOUNTER — Ambulatory Visit (INDEPENDENT_AMBULATORY_CARE_PROVIDER_SITE_OTHER): Admitting: Neurology

## 2016-04-30 ENCOUNTER — Encounter (INDEPENDENT_AMBULATORY_CARE_PROVIDER_SITE_OTHER)

## 2016-04-30 ENCOUNTER — Encounter (INDEPENDENT_AMBULATORY_CARE_PROVIDER_SITE_OTHER): Payer: Self-pay | Admitting: Neurology

## 2016-04-30 VITALS — BP 122/80 | HR 76 | Ht 65.0 in | Wt 243.0 lb

## 2016-04-30 DIAGNOSIS — G932 Benign intracranial hypertension: Principal | ICD-10-CM

## 2016-04-30 NOTE — Progress Notes (Signed)
This is a 47-month follow-up for this patient with recurrent idiopathic intracranial hypertension who lost weight and when last seen was back in remission with mild disc swelling and normal fields and no symptoms.  Due to the persistence of disc swelling, I kept her on Diamox.      Her last visit she gained weight again, but her examination was stable with mild disc swelling and normal visual function    Since last seen, she's lost about 5 pounds.  She notes normal vision, no visual obscurations, and no headaches.  She does report occasional pulsatile tinnitus in her right ear.  She's had no other issues except for a prolonged bout of an upper respiratory infection.      Current Outpatient Prescriptions:   .  acetaZOLAMIDE (DIAMOX) 250 MG tablet, , Disp: , Rfl:   .  citalopram (CELEXA) 20 MG tablet, Take 20 mg by mouth daily., Disp: , Rfl:   .  eletriptan (RELPAX) 40 MG tablet, Take 40 mg by mouth once as needed. May repeat in 2 hours if necessary. Maximum of  2 capsules in 24 hours., Disp: , Rfl:   .  fexofenadine-pseudoephedrine (ALLEGRA-D 24) 180-240 MG tablet, Take 1 tablet by mouth., Disp: , Rfl:   .  fluticasone propionate (FLONASE) 50 MCG/ACT nasal spray, Spray 2 sprays into each nostril., Disp: , Rfl:   .  Multiple Vitamin (MULTIVITAMIN) per tablet, Take 1 tablet by mouth daily., Disp: , Rfl:   .  omega-3 fatty acids (OMEGA 3) 1000 MG capsule, Take 1 g by mouth daily., Disp: , Rfl:       Allergies   Allergen Reactions   . Sulfa Drugs Unspecified, Other and Swelling     CONV. REACTION:Swelling   . Ioversol Unspecified     Visual acuity is 20/15 -1 and the right eye and 20/20 in the left eye at distance with contacts  Eye movements are full  Pupils are 5 mm and briskly reactive with no afferent defect    [facility] 30-2 threshold visual field testing is normal in the right eye with a mean deviation of -0.39 and the left eye is now normal with a mean deviation of -1.26.  These are slightly improved.      Dilated funduscopic examination shows mild  bilateral disc swelling . This appears unchanged.    BP 122/80 (BP Location: Left arm, BP Patient Position: Sitting, BP cuff size: Regular)  Pulse 76  Ht 5\' 5"  (1.651 m)  Wt 110.2 kg (243 lb)  LMP  (LMP Unknown)  BMI 40.44 kg/m2      Impression: Idiopathic intracranial hypertension    [facility] appears to be doing some better.  For the most part she is asymptomatic, her fields are normal, her nerves are still slightly elevated.  It's unclear to me what her baseline nerves will look like.  On her next visit we will start reduce the Diamox.  She is doing well enough that I'll see her back in 3 months.  I continue to encourage weight loss.      Electronically signed by: Zara Chess, MD   Signature Derived From Controlled Access Password, April 30, 2016, 3:01 PM

## 2016-06-19 ENCOUNTER — Other Ambulatory Visit (INDEPENDENT_AMBULATORY_CARE_PROVIDER_SITE_OTHER): Payer: Self-pay | Admitting: Neurology

## 2016-06-19 DIAGNOSIS — G932 Benign intracranial hypertension: Principal | ICD-10-CM

## 2016-06-20 MED ORDER — ACETAZOLAMIDE 250 MG OR TABS
ORAL_TABLET | ORAL | 1 refills | Status: DC
Start: 2016-06-20 — End: 2017-02-20

## 2016-06-20 NOTE — Telephone Encounter (Signed)
06/20/16  Refill request Diamox 250 mg #180 Rf x 1  appt 07/23/16  [facility] [phone] [place]

## 2016-07-23 ENCOUNTER — Ambulatory Visit (INDEPENDENT_AMBULATORY_CARE_PROVIDER_SITE_OTHER): Admitting: Neurology

## 2016-07-23 ENCOUNTER — Telehealth (INDEPENDENT_AMBULATORY_CARE_PROVIDER_SITE_OTHER): Payer: Self-pay | Admitting: Neurology

## 2016-07-23 ENCOUNTER — Encounter (INDEPENDENT_AMBULATORY_CARE_PROVIDER_SITE_OTHER): Payer: Self-pay | Admitting: Neurology

## 2016-07-23 ENCOUNTER — Encounter (INDEPENDENT_AMBULATORY_CARE_PROVIDER_SITE_OTHER)

## 2016-07-23 VITALS — BP 126/76 | HR 80 | Ht 66.0 in | Wt 253.0 lb

## 2016-07-23 DIAGNOSIS — G932 Benign intracranial hypertension: Principal | ICD-10-CM

## 2016-07-23 NOTE — Interdisciplinary (Signed)
I, Josette Shimabukuro Arcos Rodriguez, have reviewed medications and allergies with the patient.

## 2016-07-23 NOTE — Progress Notes (Signed)
This 41 year old woman with idiopathic intracranial hypertension which comes and goes depending on her weight was last seen 3 months ago it which time she was stable.  She was not losing weight for a while, but on the last visit had lost 5 pounds.  She had no complaints such as headache.  I thought her optic nerve showed minimal swelling.    Since last seen, the patient was in [place] and gained 12 pounds but is lost most of it.  Her weight still is on the high side.  She denies any headaches, or visual obscurations.  She has rare episodes of hearing pulsatile tinnitus for seconds then it goes away.  She continues on acetazolamide 1000 mg a day.    Visual acuity is 2015 in the right eye, and 20/20 plus in the left eye at distance with contacts  Pupils are 4 mm and briskly reactive with no afferent defect  Eye movements are full  Visual fields are full  Humphrey [place] 30-2 threshold visual field testing is normal in the right eye with normal blind spot immediately deviation of +0.16.  The left eye is essentially normal with some minimal nasal depression and mean deviation of -1.6.  Patient did have a excessive I false positive rate.    Dilated funduscopic examination shows a right nerve to be nearly flat, and left is mildly elevated without clear edema.    Impression: Idiopathic  intracranial hypertension    Autumn Parker is asymptomatic and her vision is quite excellent.  I'm concerned with her failure to lose weight.  Until she does lose weight, I will keep her on the Diamox.  We will recheck her in 2 months.  She will need labs at that time.    Electronically signed by: Zara Chess, MD   Signature Derived From Controlled Access Password, Jul 23, 2016, 1:42 PM

## 2016-07-23 NOTE — Telephone Encounter (Addendum)
07/23/16- opened in  ERROR disregard .//JJA

## 2016-10-14 ENCOUNTER — Encounter (INDEPENDENT_AMBULATORY_CARE_PROVIDER_SITE_OTHER): Admitting: Neurology

## 2016-10-14 ENCOUNTER — Encounter (INDEPENDENT_AMBULATORY_CARE_PROVIDER_SITE_OTHER)

## 2016-12-30 ENCOUNTER — Encounter (INDEPENDENT_AMBULATORY_CARE_PROVIDER_SITE_OTHER): Payer: Self-pay | Admitting: Neurology

## 2016-12-30 ENCOUNTER — Ambulatory Visit (INDEPENDENT_AMBULATORY_CARE_PROVIDER_SITE_OTHER): Admitting: Neurology

## 2016-12-30 VITALS — BP 114/78 | HR 81 | Ht 66.0 in | Wt 252.0 lb

## 2016-12-30 DIAGNOSIS — G932 Benign intracranial hypertension: Principal | ICD-10-CM

## 2017-02-20 ENCOUNTER — Other Ambulatory Visit (INDEPENDENT_AMBULATORY_CARE_PROVIDER_SITE_OTHER): Payer: Self-pay | Admitting: Neurology

## 2017-02-20 DIAGNOSIS — G932 Benign intracranial hypertension: Principal | ICD-10-CM

## 2017-02-20 MED ORDER — ACETAZOLAMIDE 250 MG OR TABS
500.0000 mg | ORAL_TABLET | Freq: Two times a day (BID) | ORAL | 1 refills | Status: DC
Start: 2017-02-20 — End: 2017-05-22

## 2017-02-20 NOTE — Telephone Encounter (Signed)
02/20/17 recvd call from pt requesting a refill for Diamox 250mg , pt states she takes 2 tabs BID, per chart she takes a total of 1000mg  a day, efxd approved to [facility]//nb

## 2017-04-01 ENCOUNTER — Ambulatory Visit (INDEPENDENT_AMBULATORY_CARE_PROVIDER_SITE_OTHER): Admitting: Neurology

## 2017-04-01 ENCOUNTER — Encounter (INDEPENDENT_AMBULATORY_CARE_PROVIDER_SITE_OTHER): Payer: Self-pay | Admitting: Neurology

## 2017-04-01 ENCOUNTER — Encounter (INDEPENDENT_AMBULATORY_CARE_PROVIDER_SITE_OTHER)

## 2017-04-01 VITALS — BP 125/79 | HR 86 | Ht 65.55 in | Wt 250.0 lb

## 2017-04-01 DIAGNOSIS — G932 Benign intracranial hypertension: Principal | ICD-10-CM

## 2017-05-22 ENCOUNTER — Other Ambulatory Visit (INDEPENDENT_AMBULATORY_CARE_PROVIDER_SITE_OTHER): Payer: Self-pay | Admitting: Neurology

## 2017-05-22 DIAGNOSIS — G932 Benign intracranial hypertension: Principal | ICD-10-CM

## 2017-05-22 MED ORDER — ACETAZOLAMIDE 250 MG OR TABS
500.0000 mg | ORAL_TABLET | Freq: Two times a day (BID) | ORAL | 1 refills | Status: DC
Start: 2017-05-22 — End: 2017-11-25

## 2017-05-22 NOTE — Telephone Encounter (Signed)
Paper RX from [facility] Acetazolamide (Diamox) 250 Mg QTY 120 refill 1 Approved and faxed to [facility]//AMP

## 2017-07-01 ENCOUNTER — Encounter (INDEPENDENT_AMBULATORY_CARE_PROVIDER_SITE_OTHER)

## 2017-07-01 ENCOUNTER — Encounter (INDEPENDENT_AMBULATORY_CARE_PROVIDER_SITE_OTHER): Admitting: Neurology

## 2017-08-18 ENCOUNTER — Encounter (INDEPENDENT_AMBULATORY_CARE_PROVIDER_SITE_OTHER): Admitting: Neurology

## 2017-08-18 ENCOUNTER — Encounter (INDEPENDENT_AMBULATORY_CARE_PROVIDER_SITE_OTHER)

## 2017-08-19 ENCOUNTER — Encounter (INDEPENDENT_AMBULATORY_CARE_PROVIDER_SITE_OTHER)

## 2017-08-19 ENCOUNTER — Encounter (INDEPENDENT_AMBULATORY_CARE_PROVIDER_SITE_OTHER): Admitting: Neurology

## 2017-08-25 ENCOUNTER — Ambulatory Visit (INDEPENDENT_AMBULATORY_CARE_PROVIDER_SITE_OTHER): Admitting: Neurology

## 2017-08-25 ENCOUNTER — Encounter (INDEPENDENT_AMBULATORY_CARE_PROVIDER_SITE_OTHER): Admitting: Neurology

## 2017-08-25 ENCOUNTER — Encounter (INDEPENDENT_AMBULATORY_CARE_PROVIDER_SITE_OTHER): Payer: Self-pay | Admitting: Neurology

## 2017-08-25 VITALS — BP 115/74 | HR 69 | Ht 65.5 in | Wt 256.8 lb

## 2017-11-25 ENCOUNTER — Ambulatory Visit (INDEPENDENT_AMBULATORY_CARE_PROVIDER_SITE_OTHER): Admitting: Neurology

## 2017-11-25 ENCOUNTER — Encounter (INDEPENDENT_AMBULATORY_CARE_PROVIDER_SITE_OTHER)

## 2017-11-25 ENCOUNTER — Encounter (INDEPENDENT_AMBULATORY_CARE_PROVIDER_SITE_OTHER): Payer: Self-pay | Admitting: Neurology

## 2017-11-25 VITALS — BP 121/82 | HR 73 | Ht 65.5 in | Wt 258.0 lb

## 2017-11-25 DIAGNOSIS — G932 Benign intracranial hypertension: Principal | ICD-10-CM

## 2017-11-25 MED ORDER — ALLEGRA-D 24 HOUR PO
ORAL | Status: DC
Start: ? — End: 2018-03-31

## 2018-02-17 ENCOUNTER — Encounter (INDEPENDENT_AMBULATORY_CARE_PROVIDER_SITE_OTHER)

## 2018-02-17 ENCOUNTER — Encounter (INDEPENDENT_AMBULATORY_CARE_PROVIDER_SITE_OTHER): Admitting: Neurology

## 2018-03-31 ENCOUNTER — Ambulatory Visit (INDEPENDENT_AMBULATORY_CARE_PROVIDER_SITE_OTHER): Admitting: Neurology

## 2018-03-31 ENCOUNTER — Encounter (INDEPENDENT_AMBULATORY_CARE_PROVIDER_SITE_OTHER): Payer: Self-pay | Admitting: Neurology

## 2018-03-31 VITALS — BP 123/81 | HR 76 | Ht 65.5 in | Wt 260.0 lb

## 2018-03-31 MED ORDER — ELETRIPTAN HYDROBROMIDE 40 MG OR TABS
40.0000 mg | ORAL_TABLET | Freq: Once | ORAL | 11 refills | Status: DC | PRN
Start: 2018-03-31 — End: 2019-06-29

## 2018-07-21 ENCOUNTER — Ambulatory Visit (INDEPENDENT_AMBULATORY_CARE_PROVIDER_SITE_OTHER): Admitting: Neurology

## 2018-07-21 ENCOUNTER — Encounter (INDEPENDENT_AMBULATORY_CARE_PROVIDER_SITE_OTHER): Payer: Self-pay | Admitting: Neurology

## 2018-07-21 VITALS — BP 129/90 | HR 77 | Temp 97.5°F | Ht 65.5 in | Wt 253.0 lb

## 2019-01-13 ENCOUNTER — Telehealth (INDEPENDENT_AMBULATORY_CARE_PROVIDER_SITE_OTHER): Payer: Self-pay | Admitting: Medical

## 2019-01-13 ENCOUNTER — Encounter (INDEPENDENT_AMBULATORY_CARE_PROVIDER_SITE_OTHER): Payer: Self-pay

## 2019-01-13 ENCOUNTER — Telehealth (INDEPENDENT_AMBULATORY_CARE_PROVIDER_SITE_OTHER): Admitting: Medical

## 2019-01-13 ENCOUNTER — Encounter (INDEPENDENT_AMBULATORY_CARE_PROVIDER_SITE_OTHER): Payer: Self-pay | Admitting: Medical

## 2019-01-14 NOTE — Telephone Encounter (Signed)
Reviewed ER records.

## 2019-01-17 ENCOUNTER — Telehealth (INDEPENDENT_AMBULATORY_CARE_PROVIDER_SITE_OTHER): Payer: Self-pay | Admitting: Family Medicine

## 2019-01-17 ENCOUNTER — Encounter (INDEPENDENT_AMBULATORY_CARE_PROVIDER_SITE_OTHER): Payer: Self-pay

## 2019-01-17 NOTE — Progress Notes (Signed)
Needs to be signed and faxed back
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
## 2019-01-17 NOTE — Telephone Encounter (Signed)
sw Pt, informed of the below. Agrees to precautions. Autumn Parker

## 2019-01-18 ENCOUNTER — Ambulatory Visit (INDEPENDENT_AMBULATORY_CARE_PROVIDER_SITE_OTHER): Admitting: Neurology

## 2019-01-18 ENCOUNTER — Encounter (INDEPENDENT_AMBULATORY_CARE_PROVIDER_SITE_OTHER): Payer: Self-pay | Admitting: Neurology

## 2019-01-18 VITALS — BP 138/82 | HR 79 | Temp 97.5°F | Wt 266.6 lb

## 2019-01-18 NOTE — Telephone Encounter (Signed)
noted 
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
## 2019-01-18 NOTE — Interdisciplinary (Signed)
I, Nadja Lina, have reviewed the patients allergy and medication lists.//KTH

## 2019-01-19 ENCOUNTER — Encounter (INDEPENDENT_AMBULATORY_CARE_PROVIDER_SITE_OTHER): Admitting: Neurology

## 2019-01-25 NOTE — Progress Notes (Signed)
Faxed back signed copy.

## 2019-01-25 NOTE — Progress Notes (Signed)
Printed and in [place] folder for JC sig.

## 2019-02-16 ENCOUNTER — Telehealth (INDEPENDENT_AMBULATORY_CARE_PROVIDER_SITE_OTHER): Admitting: Nurse Practitioner

## 2019-02-16 VITALS — Ht 65.5 in | Wt 266.5 lb

## 2019-02-16 MED ORDER — TIZANIDINE HCL 4 MG OR TABS
4.0000 mg | ORAL_TABLET | Freq: Every evening | ORAL | 0 refills | Status: DC | PRN
Start: 2019-02-16 — End: 2019-05-12
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
## 2019-02-16 NOTE — Patient Instructions (Signed)
Plan:  - Continue other therapies  - Continue to remain active and use compensatory strategies such as calenders and "to do" lists  - Start Tizanadine 1/2 tab (2 mg) at night as needed for neck pain  - Follow-up in 6-8 weeks

## 2019-02-16 NOTE — Interdisciplinary (Signed)
[name], have reviewed medications and allergies with patient.

## 2019-02-23 ENCOUNTER — Encounter (INDEPENDENT_AMBULATORY_CARE_PROVIDER_SITE_OTHER): Payer: Self-pay | Admitting: Neurology

## 2019-03-02 NOTE — Telephone Encounter (Signed)
Dr. Fara Olden,    Please see message below.//akp

## 2019-03-09 NOTE — Telephone Encounter (Signed)
03/09/2019  Sent copy of letter to pt address on file.//kth

## 2019-03-09 NOTE — Telephone Encounter (Signed)
Kat please see message.//akp

## 2019-03-30 ENCOUNTER — Encounter (INDEPENDENT_AMBULATORY_CARE_PROVIDER_SITE_OTHER): Payer: Self-pay | Admitting: Nurse Practitioner

## 2019-03-30 ENCOUNTER — Telehealth (INDEPENDENT_AMBULATORY_CARE_PROVIDER_SITE_OTHER): Admitting: Nurse Practitioner

## 2019-03-30 VITALS — Ht 65.5 in | Wt 266.5 lb

## 2019-03-30 NOTE — Patient Instructions (Signed)
Plan:  - MRI C-Spine w/o  - Continue other physical therapies  - Continue to remain active and use compensatory strategies such as calenders and "to do" lists  - Can increase Tizanidine to a full tab 4mg   - Follow-up after diagnostic imaging

## 2019-03-30 NOTE — Interdisciplinary (Signed)
[name], have reviewed medications and allergies with patient.
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
## 2019-04-07 ENCOUNTER — Telehealth (INDEPENDENT_AMBULATORY_CARE_PROVIDER_SITE_OTHER): Payer: Self-pay

## 2019-04-07 NOTE — Telephone Encounter (Signed)
Autumn Parker,     Member ID: [id number]  Case Number: [id number]     MRI CERVICAL SPINE W/O CONTRAST was denied. Your records show that you have a problem with your neck. The reason this request cannot be approved is because: 1 one of the following muts be met. -Your doctor found that  You did not improved after a recent (within three months) six weeks trial of doctor prescribed treatment during a follow up contact (in [place], by phone, by mail or by messaging). -You have a sing or symptoms that suggest a severe problem (serius underlying condition) for which a trial of treatment is not needed. Your records do not show that one of these applies to you. We have told your doctor about this. Please talk yo your doctor if you have questions.         A peer to peer can be done by calling [phone]. Please advise when complete with the auth number, valid dates and facility approved to if applicable.   The referral will stay open for 5 business days, after the fifth business day the referral will be closed. You are able to complete peer to peer still after it is closed in Epic. If you decide to complete peer to peer, please route message to the Referral Coordinator so we can process the referral.     Thank you,   Thayer Ohm

## 2019-05-09 ENCOUNTER — Encounter (INDEPENDENT_AMBULATORY_CARE_PROVIDER_SITE_OTHER): Payer: Self-pay | Admitting: Family Medicine

## 2019-05-11 ENCOUNTER — Encounter (INDEPENDENT_AMBULATORY_CARE_PROVIDER_SITE_OTHER): Payer: Self-pay | Admitting: Hospital

## 2019-05-11 ENCOUNTER — Other Ambulatory Visit: Payer: Self-pay

## 2019-05-12 ENCOUNTER — Telehealth: Admitting: Medical
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
## 2019-05-17 ENCOUNTER — Encounter (INDEPENDENT_AMBULATORY_CARE_PROVIDER_SITE_OTHER): Payer: Self-pay | Admitting: Family Medicine

## 2019-06-29 ENCOUNTER — Other Ambulatory Visit (INDEPENDENT_AMBULATORY_CARE_PROVIDER_SITE_OTHER): Payer: Self-pay | Admitting: Neurology

## 2019-06-29 MED ORDER — ELETRIPTAN HYDROBROMIDE 40 MG OR TABS
40.0000 mg | ORAL_TABLET | Freq: Once | ORAL | 5 refills | Status: DC | PRN
Start: 2019-06-29 — End: 2020-09-12

## 2019-06-29 NOTE — Telephone Encounter (Signed)
 Date:   06/29/2019    Received: Fax  Medication: eletriptan  (RELPAX ) 40 MG tablet #9  Sig:  Take 1 tablet (40 mg) by mouth once as needed for Migraine. May repeat in 2 hours if necessary. Maximum of 2 capsules in 24 hours    Last refill: 02/16/2019  Last ov: 03/30/2019  Next ov: 07/19/2019    Pharmacy/Phone number:  [id number] - [place], [place] - [address]     Notes:  eRx'd // dec

## 2019-06-30 ENCOUNTER — Encounter (INDEPENDENT_AMBULATORY_CARE_PROVIDER_SITE_OTHER): Payer: Self-pay | Admitting: Family Medicine

## 2019-07-01 ENCOUNTER — Encounter (INDEPENDENT_AMBULATORY_CARE_PROVIDER_SITE_OTHER)

## 2019-07-19 ENCOUNTER — Encounter (INDEPENDENT_AMBULATORY_CARE_PROVIDER_SITE_OTHER)

## 2019-07-19 ENCOUNTER — Encounter (INDEPENDENT_AMBULATORY_CARE_PROVIDER_SITE_OTHER): Admitting: Neurology

## 2019-08-10 ENCOUNTER — Encounter (INDEPENDENT_AMBULATORY_CARE_PROVIDER_SITE_OTHER)

## 2019-08-10 ENCOUNTER — Encounter (INDEPENDENT_AMBULATORY_CARE_PROVIDER_SITE_OTHER): Admitting: Neurology

## 2019-08-20 ENCOUNTER — Other Ambulatory Visit (INDEPENDENT_AMBULATORY_CARE_PROVIDER_SITE_OTHER): Payer: Self-pay

## 2020-05-16 ENCOUNTER — Encounter (INDEPENDENT_AMBULATORY_CARE_PROVIDER_SITE_OTHER): Payer: Self-pay | Admitting: Physician Assistant

## 2020-06-26 ENCOUNTER — Telehealth (INDEPENDENT_AMBULATORY_CARE_PROVIDER_SITE_OTHER): Payer: Self-pay | Admitting: Neurology

## 2020-06-26 NOTE — Telephone Encounter (Signed)
 06/26/2020    Good afternoon Dr. Fara Olden,    I rcvd a call from patient requesting to scheduled a Visual Field and Follow Up with you.    Patient has had a numerous amount of cancellations with you for Visual Fields as well as Follow Ups as of 07/2019 - 08/2019.    Per Irma B., we will need the okay from yourself to place the patient on your schedule before doing so.    Patient is aware once we get a response from yourself, she will rcv a call back from our office with an answer. Please advise.    Thank you so much,  -Jayme Cloud, Steffanie Dunn

## 2020-06-27 NOTE — Telephone Encounter (Signed)
 Okay for visual field and follow-up.  Let her know she has had multiple cancellations, and if she does not make this appointment we will have to discharge her from our care.  It is very important that she remain compliant if she wants me to take care of her.

## 2020-06-27 NOTE — Telephone Encounter (Signed)
 06/27/2020    Patient scheduled for VF and f/u for 09/04/2020.    Patient made aware she will need to make both appts if not, she will be discharged from care.    Jayme Cloud, Steffanie Dunn

## 2020-07-03 ENCOUNTER — Telehealth (INDEPENDENT_AMBULATORY_CARE_PROVIDER_SITE_OTHER): Payer: Self-pay | Admitting: Nurse Practitioner

## 2020-07-03 NOTE — Telephone Encounter (Signed)
 07/03/20 received medical record release form from [facility] [employer], placed in [employer] inbox.//ch

## 2020-08-26 ENCOUNTER — Encounter (INDEPENDENT_AMBULATORY_CARE_PROVIDER_SITE_OTHER): Payer: Self-pay | Admitting: Family Medicine

## 2020-08-27 ENCOUNTER — Telehealth: Admitting: Family Medicine

## 2020-08-27 ENCOUNTER — Encounter (INDEPENDENT_AMBULATORY_CARE_PROVIDER_SITE_OTHER): Payer: Self-pay | Admitting: Family Medicine

## 2020-08-27 ENCOUNTER — Encounter (INDEPENDENT_AMBULATORY_CARE_PROVIDER_SITE_OTHER): Admitting: Neurology

## 2020-08-27 ENCOUNTER — Other Ambulatory Visit: Payer: Self-pay

## 2020-08-27 ENCOUNTER — Encounter (INDEPENDENT_AMBULATORY_CARE_PROVIDER_SITE_OTHER)

## 2020-08-27 MED ORDER — ALBUTEROL SULFATE 108 (90 BASE) MCG/ACT IN AERS
2.0000 | INHALATION_SPRAY | Freq: Four times a day (QID) | RESPIRATORY_TRACT | 0 refills | Status: DC | PRN
Start: 2020-08-27 — End: 2022-02-11

## 2020-08-27 MED ORDER — NIRMATRELVIR & RITONAVIR 20 X 150 MG & 10 X 100MG PO TBPK
ORAL_TABLET | ORAL | 0 refills | Status: AC
Start: 2020-08-27 — End: 2020-09-01

## 2020-08-27 NOTE — Progress Notes (Signed)
 This visit was conducted utilizing [employer], an Optometrist and video telecommunications system that permits real time communication between the patient and the provider. Patient understands the risks, benefits, and alternatives to a telemedicine visit and that her insurance will be billed as an telemedicine encounter. Trystin gives verbal consent to this telemedicine visit and will be responsible for any copay or deductible required by his insurance.    Chief complaint and any patient reported vitals recorded and entered per MA by telephone interview.     Chief Complaint:  Autumn Parker is a 45 year old female had concerns including COVID-19 Symptoms.    Subjective:   HPI   She states she is feeling unwell. Had sore throat and stuffy nose yesterday, then fatigue. covid test positive yesterday too. Isolating from family.   Very mild cough, no dyspnea. Feels body ache. Possibly had fever last night. No loss of taste/smell.   UC PHQ9 DEPRESSION QUESTIONNAIRE 05/11/2019   Interest 1   Depressed 1   Sleep --   Energy --   Appetite --   Failure --   Concentration --   Movement --   Suicide --   Summary(Manual) --   Summary(Calculated) --   Functional --   Some recent data might be hidden      Allergies as of 08/27/2020 - Verified 03/30/2019   Allergen Reaction Noted   . Sulfa drugs Unspecified, Other, and Swelling 10/04/2008   . Contrast media [diagnostic x-ray materials] Unspecified 04/01/2017   . Ioversol Unspecified 06/07/2014     Current Outpatient Medications on File Prior to Visit   Medication Sig   . citalopram (CELEXA) 20 MG tablet Take 20 mg by mouth daily.   Marland Kitchen eletriptan (RELPAX) 40 MG tablet Take 1 tablet (40 mg) by mouth once as needed for Migraine. May repeat in 2 hours if necessary. Maximum of  2 capsules in 24 hours.     No current facility-administered medications on file prior to visit.      Social History     Tobacco Use   Smoking Status Former Smoker   Smokeless Tobacco Never Used      Review of Systems    Objective:   Vital signs unable to be obtained due to audio-visual encounter and patient not able to collect data.  There were no vitals filed for this visit.  There is no height or weight on file to calculate BMI.     General: Awake, alert, non-toxic, well appearing, well-nourished, speaking in full sentences with no acute respiratory distress  Eyes: No visible conjunctival injection or scleral icterus. EOMI  ENT: External nose normal, nonerythematous. No audible nasal congestion. Normal sound of voice, lips and mucous membranes moist  Neck: No visible swollen lymph nodes nor goiter. No meningismus. Full ROM. Non-tender submandibular and cervical nodes upon patient directed self-palpation   Respiratory: Normal effort, no audible cough, wheeze, or stridor. No tachypnea or accessory muscle use  Skin: No pallor, cyanosis, or diaphoresis noted. No rash noted on visible skin  MSK: Normal movement of all upper extremity joints, symmetric  Neuro: Nonfocal neurologic exam by observation of head, face, torso, and upper extremities  Psych: Normal mood and affect, oriented, No altered mental status, appropriately conversant, follows commands     Assessment/Plan:  Keyshia was seen today for covid-19 symptoms.    Diagnoses and all orders for this visit:    1. COVID-19  Comments:  supportive care discussed. no dyspnea at this time, monitoring  in setting of asthma. trial of paxlovid (within 5d window).  Orders:  -     nirmatrelvir-ritonavir (PAXLOVID) 150-100 mg tablets; Take 2 tablets (300 mg) of nirmatrelvir AND 1 tablet (100 mg) of ritonavir together two times daily for 5 days.  Dispense: 30 tablet; Refill: 0    2. Mild intermittent asthma, unspecified whether complicated  Comments:  albuterol prescribed to be used prn for wheezing  Orders:  -     albuterol 108 (90 Base) MCG/ACT inhaler; Inhale 2 puffs by mouth every 6 hours as needed for Wheezing.  Dispense: 1 each; Refill: 0    3. Diabetes mellitus  screening  -     CMP; Future  -     Glycosylated Hgb(A1C), Blood [place]ender; Future    4. Lipid screening    5. Family history of thyroid disease  -     TSH Reflex to T4 - [facility]; Future    6. Screening, iron deficiency anemia  -     CBC w/ Diff [place]; Future         With the ongoing COVID 19 pandemic, I took the time to review COVID 19 screening and testing protocol, as well as precautions - shelter in place as per government orders; social distancing of staying 6 feet apart, not gathering in groups, wash hands with soap and water for 20 seconds, face coverage while in public, and cover any cough with an elbow. Questions answered about COVID vaccine.    Call us if any shortness of breath, cough, fever over 100.4; or if shortness of breath or cough not resolved in 3 weeks.  If suffering from significant trouble breathing and unable to reach [place] quickly, go to the emergency room.    Patient Instructions:  See Patient Education/Instructions section in Epic. Reviewed verbally and AVS available via MyChart for patient     No follow-ups on file.      Rana Snare, M.D. MPH    [facility]  [address], [address]  [place], [place] [address]    Signature Derived From Controlled Access Password, 08/27/2020, 11:05 AM

## 2020-08-28 ENCOUNTER — Encounter (INDEPENDENT_AMBULATORY_CARE_PROVIDER_SITE_OTHER): Payer: Self-pay | Admitting: Neurology

## 2020-09-04 ENCOUNTER — Encounter (INDEPENDENT_AMBULATORY_CARE_PROVIDER_SITE_OTHER)

## 2020-09-04 ENCOUNTER — Encounter (INDEPENDENT_AMBULATORY_CARE_PROVIDER_SITE_OTHER): Payer: Self-pay | Admitting: Family Medicine

## 2020-09-04 ENCOUNTER — Ambulatory Visit (INDEPENDENT_AMBULATORY_CARE_PROVIDER_SITE_OTHER): Admitting: Neurology

## 2020-09-04 VITALS — BP 134/88 | HR 82 | Ht 65.5 in | Wt 266.5 lb

## 2020-09-12 ENCOUNTER — Other Ambulatory Visit (INDEPENDENT_AMBULATORY_CARE_PROVIDER_SITE_OTHER): Payer: Self-pay | Admitting: Neurology

## 2020-09-12 DIAGNOSIS — G43009 Migraine without aura, not intractable, without status migrainosus: Secondary | ICD-10-CM

## 2020-09-12 MED ORDER — ELETRIPTAN HYDROBROMIDE 40 MG OR TABS
40.0000 mg | ORAL_TABLET | Freq: Once | ORAL | 5 refills | Status: DC | PRN
Start: 2020-09-12 — End: 2022-02-11

## 2020-10-30 ENCOUNTER — Telehealth (INDEPENDENT_AMBULATORY_CARE_PROVIDER_SITE_OTHER): Payer: Self-pay | Admitting: Clinical Neuropsychologist

## 2020-10-31 ENCOUNTER — Ambulatory Visit (INDEPENDENT_AMBULATORY_CARE_PROVIDER_SITE_OTHER): Admitting: Clinical Neuropsychologist

## 2020-11-20 ENCOUNTER — Encounter (INDEPENDENT_AMBULATORY_CARE_PROVIDER_SITE_OTHER): Admitting: Clinical Neuropsychologist

## 2020-11-21 ENCOUNTER — Telehealth: Admitting: Clinical Neuropsychologist

## 2020-11-21 ENCOUNTER — Telehealth (INDEPENDENT_AMBULATORY_CARE_PROVIDER_SITE_OTHER): Payer: Self-pay | Admitting: Clinical Neuropsychologist

## 2020-11-21 NOTE — Telephone Encounter (Signed)
 Mailed NPT report to patient's home address in Epic per Dr. Lucienne Minks. //pm

## 2020-12-05 ENCOUNTER — Telehealth (INDEPENDENT_AMBULATORY_CARE_PROVIDER_SITE_OTHER): Admitting: Nurse Practitioner

## 2020-12-06 ENCOUNTER — Telehealth: Admitting: Physician Assistant

## 2020-12-06 ENCOUNTER — Encounter (INDEPENDENT_AMBULATORY_CARE_PROVIDER_SITE_OTHER): Payer: Self-pay | Admitting: Physician Assistant

## 2020-12-06 VITALS — Ht 65.0 in

## 2020-12-06 NOTE — Patient Instructions (Signed)
-   Continue speech therapy  - Split night sleep study and CPAP titration  - Consult with Dr. Eugenie Filler after sleep evaluation.  - F/U in 2 months
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
## 2020-12-06 NOTE — Interdisciplinary (Signed)
I, Autumn Parker, have reviewed allergies and medications with patient.

## 2020-12-20 ENCOUNTER — Telehealth (INDEPENDENT_AMBULATORY_CARE_PROVIDER_SITE_OTHER): Payer: Self-pay | Admitting: Physician Assistant

## 2020-12-20 NOTE — Telephone Encounter (Signed)
 Judeth Cornfield,     Member ID: [id number]  Case Number: [id number]    Sleep study was denied due to doesn't meet criteria (denial letter uploaded to media for further review). A peer to peer can be done by calling [employer] at [phone]. Please advise when complete with the auth number, valid dates and facility approved to if applicable.   The referral will stay open for 5 business days, after the fifth business day the referral will be closed. You are able to complete peer to peer still after it is closed in Epic. If you decide to complete peer to peer, please route message to the Referral Coordinator so we can process the referral.     Thank you.

## 2020-12-21 NOTE — Telephone Encounter (Signed)
 Hi Greig Castilla,  A patient of yours and stephanie needs a peer to peer for the sleep study can you please Advise back if you could to the peer to peer for me to able to schedule.//akg

## 2020-12-21 NOTE — Telephone Encounter (Signed)
 Hi Autumn Parker,  Can you please Advise if you can do you this peer to peer.//akg

## 2020-12-24 NOTE — Telephone Encounter (Signed)
 12/24/20  Sent a secure message to stephanie.//akg

## 2020-12-25 ENCOUNTER — Telehealth (INDEPENDENT_AMBULATORY_CARE_PROVIDER_SITE_OTHER): Payer: Self-pay | Admitting: Physician Assistant

## 2020-12-25 NOTE — Telephone Encounter (Signed)
 12/25/20  Schedule peer to peer with stephanie at 3;00pm stephanie is aware provided her number and my just in case.//akg

## 2020-12-25 NOTE — Telephone Encounter (Signed)
 Hi Renee,  Peer to peer was approve for sleep study  Authorization #: [id number].//akg

## 2021-01-09 ENCOUNTER — Telehealth: Admitting: Family Medicine

## 2021-01-09 ENCOUNTER — Encounter (INDEPENDENT_AMBULATORY_CARE_PROVIDER_SITE_OTHER): Payer: Self-pay | Admitting: Family Medicine

## 2021-01-09 VITALS — Ht 65.0 in | Wt 260.0 lb

## 2021-01-09 MED ORDER — FLUTICASONE PROPIONATE 50 MCG/ACT NA SUSP
1.0000 | Freq: Every day | NASAL | Status: DC
Start: ? — End: 2021-02-11

## 2021-01-09 MED ORDER — MODAFINIL PO
ORAL | Status: DC
Start: ? — End: 2021-02-11

## 2021-02-08 ENCOUNTER — Telehealth (INDEPENDENT_AMBULATORY_CARE_PROVIDER_SITE_OTHER): Payer: Self-pay | Admitting: Family Medicine

## 2021-02-11 ENCOUNTER — Encounter (INDEPENDENT_AMBULATORY_CARE_PROVIDER_SITE_OTHER): Payer: Self-pay | Admitting: Family Medicine

## 2021-02-11 ENCOUNTER — Ambulatory Visit (INDEPENDENT_AMBULATORY_CARE_PROVIDER_SITE_OTHER): Admitting: Family Medicine

## 2021-02-11 VITALS — BP 122/78 | HR 75 | Temp 97.0°F | Wt 271.4 lb

## 2021-02-11 LAB — UA, CHEM ONLY POCT
Specific Gravity: 1.015 (ref 1.002–1.030)
Urobilinogen: 0.2 (ref 0.2–1.0)
pH: 5 (ref 5–8)

## 2021-02-11 MED ORDER — CEPHALEXIN 500 MG OR CAPS
500.0000 mg | ORAL_CAPSULE | Freq: Three times a day (TID) | ORAL | 0 refills | Status: AC
Start: 2021-02-11 — End: 2021-02-16

## 2021-02-11 MED ORDER — MULTIVITAMINS OR CAPS
ORAL_CAPSULE | ORAL | Status: DC
Start: ? — End: 2021-02-11

## 2021-02-11 MED ORDER — TACROLIMUS 0.1 % EX CREA
TOPICAL_CREAM | CUTANEOUS | Status: DC
Start: ? — End: 2021-02-11

## 2021-02-11 MED ORDER — FLUOCINOLONE ACETONIDE BODY 0.01 % EX OIL
TOPICAL_OIL | CUTANEOUS | Status: DC
Start: ? — End: 2021-02-11

## 2021-02-11 MED ORDER — FLUOROURACIL 5 % EX CREA
TOPICAL_CREAM | CUTANEOUS | Status: DC
Start: 2020-11-12 — End: 2021-02-11

## 2021-02-11 MED ORDER — ZILXI 1.5 % EX FOAM
CUTANEOUS | Status: DC
Start: 2020-11-12 — End: 2021-02-11

## 2021-02-12 ENCOUNTER — Encounter (INDEPENDENT_AMBULATORY_CARE_PROVIDER_SITE_OTHER): Payer: Self-pay | Admitting: Family Medicine

## 2021-02-12 LAB — URINALYSIS
Bilirubin: NEGATIVE
Blood: NEGATIVE
Glucose: NEGATIVE
Ketones: NEGATIVE
Leuk Esterase: NEGATIVE
Nitrite: NEGATIVE
Protein: NEGATIVE
Specific Gravity: 1.016 (ref 1.005–1.030)
Urobilinogen,Semi-Qn: 0.2 mg/dL (ref 0.2–1.0)
pH: 7 (ref 5.0–7.5)

## 2021-02-12 LAB — URINALYSIS MICROSCOPIC ONLY, RANDOM URINE
Bacteria: NONE SEEN
Casts: NONE SEEN /lpf
WBC: NONE SEEN /hpf (ref 0–5)

## 2021-02-15 LAB — URINE CULTURE

## 2021-02-17 ENCOUNTER — Encounter (INDEPENDENT_AMBULATORY_CARE_PROVIDER_SITE_OTHER): Payer: Self-pay | Admitting: Family Medicine

## 2021-04-11 IMAGING — US US Carotid Doppler Complete Bilateral
1 series · 12 of 16 positions shown · non-contrast
Comparison: None.

US Carotid Doppler Complete
INDICATION: Depression.                                                                  
 Pertinent History: HTN. Calcification seen on dental x-rays.
TECHNIQUE: Duplex sonographic evaluation of the bilateral carotid arteries was            
 performed. Measurement of carotid stenosis is based on velocity parameters that           
 correlate the residual internal carotid diameter with [employer] Symptomatic          
 Carotid Endarterectomy Trial (NASCET)-based stenosis levels.                              
 Technologist Comments: None.                                                              
 Limitations: None.

[Series 1: us carotid doppler complete bilateral · 12 of 40 slices shown]
[im 1/40]
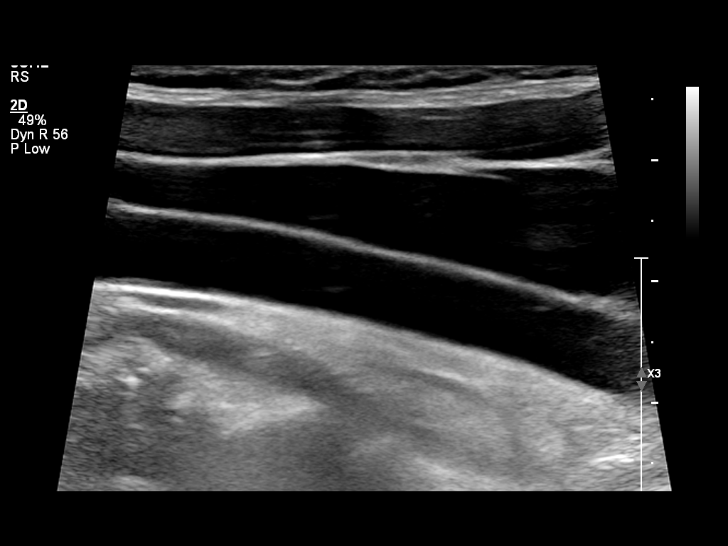
[im 6/40]
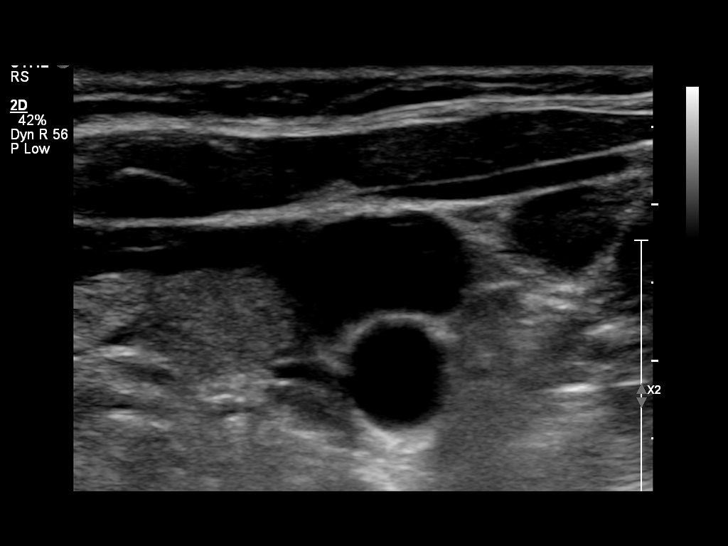
[im 8/40]
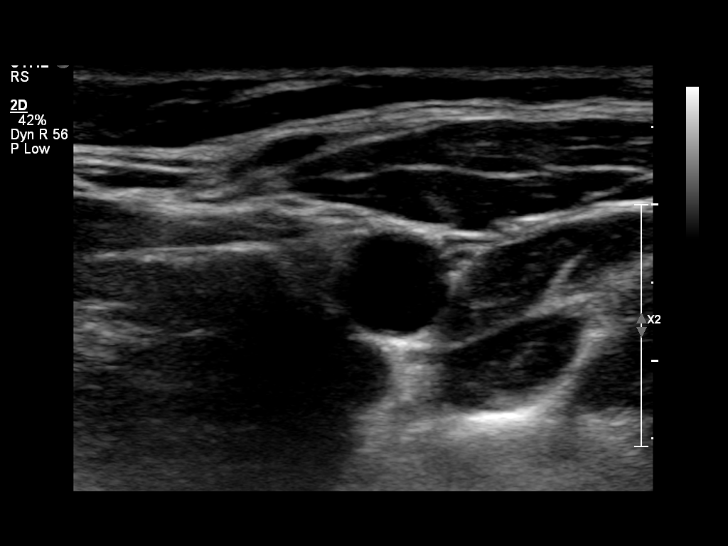
[im 11/40]
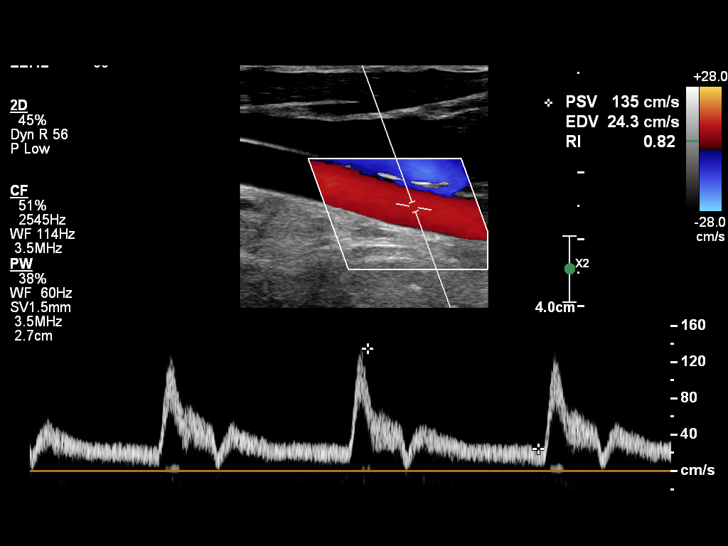
[im 16/40]
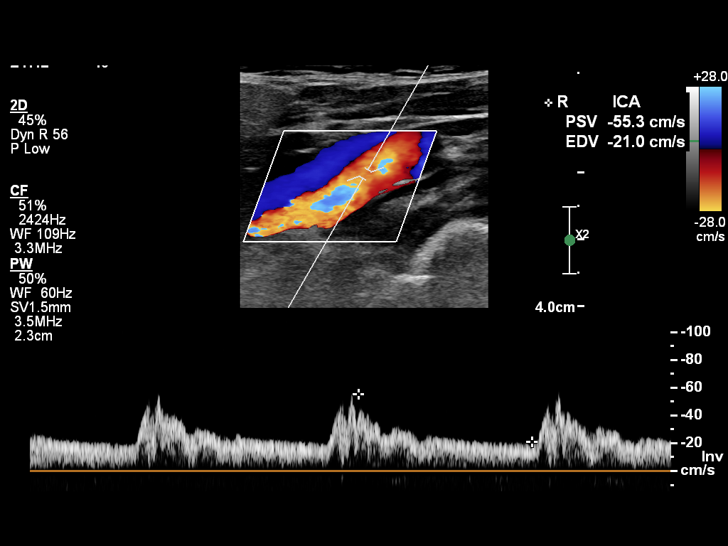
[im 19/40]
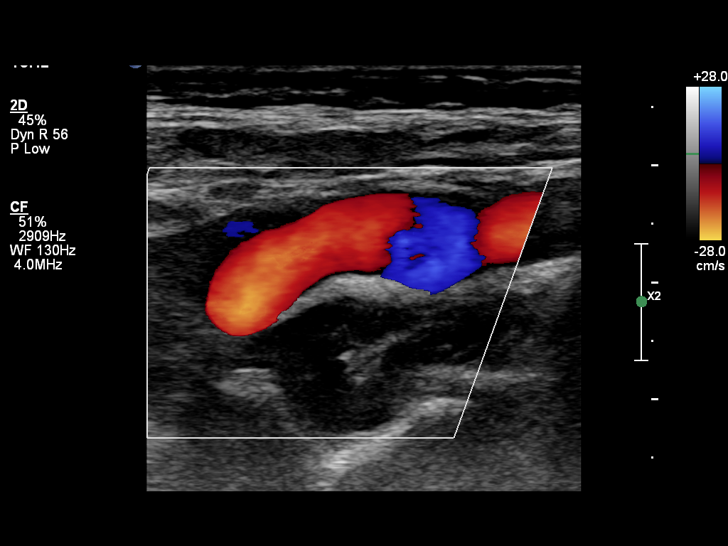
[im 21/40]
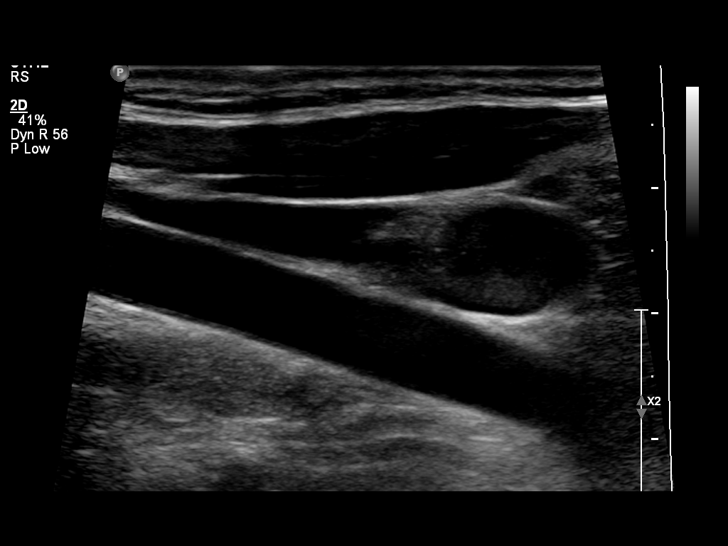
[im 27/40]
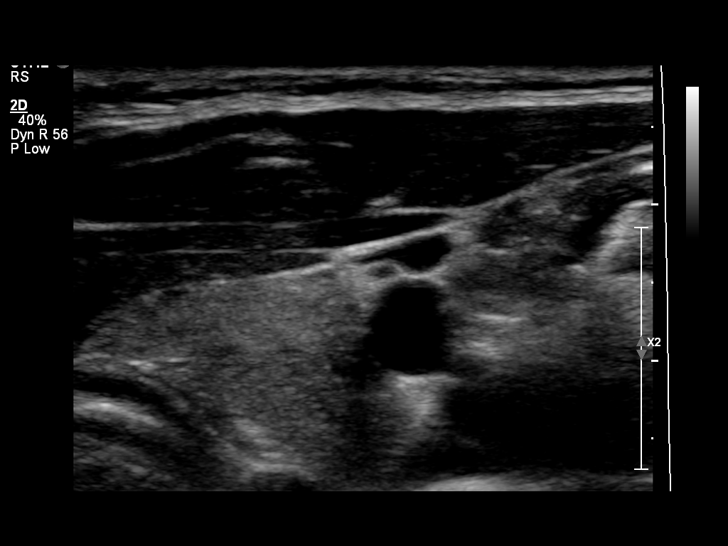
[im 29/40]
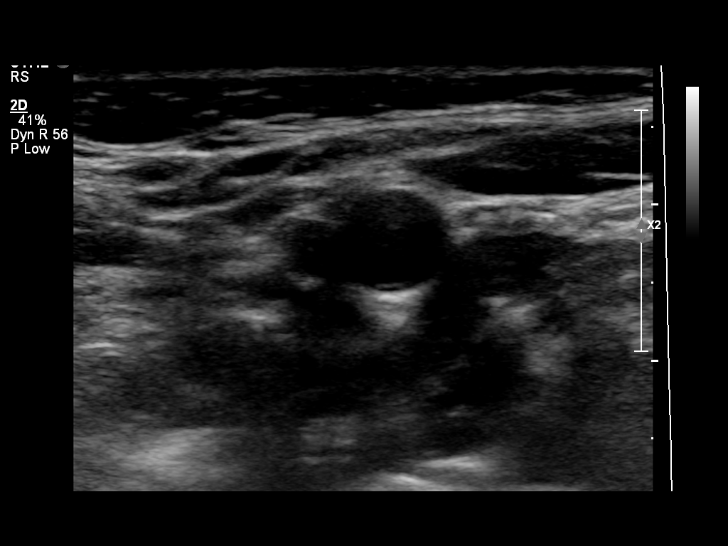
[im 32/40]
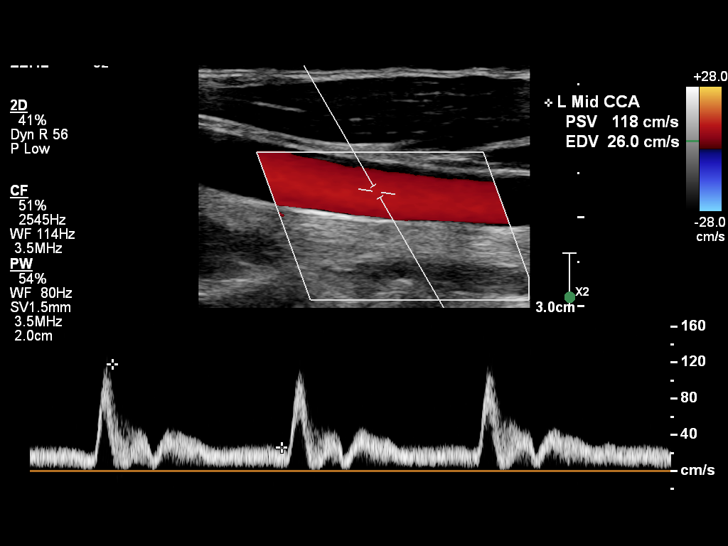
[im 37/40]
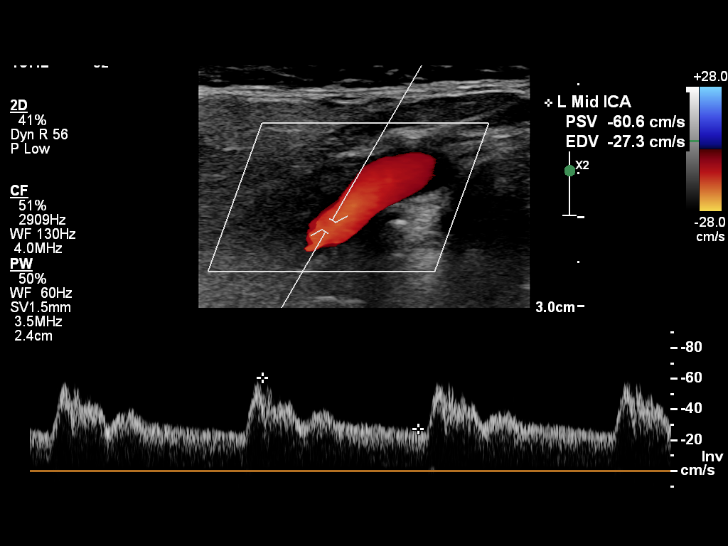
[im 40/40]
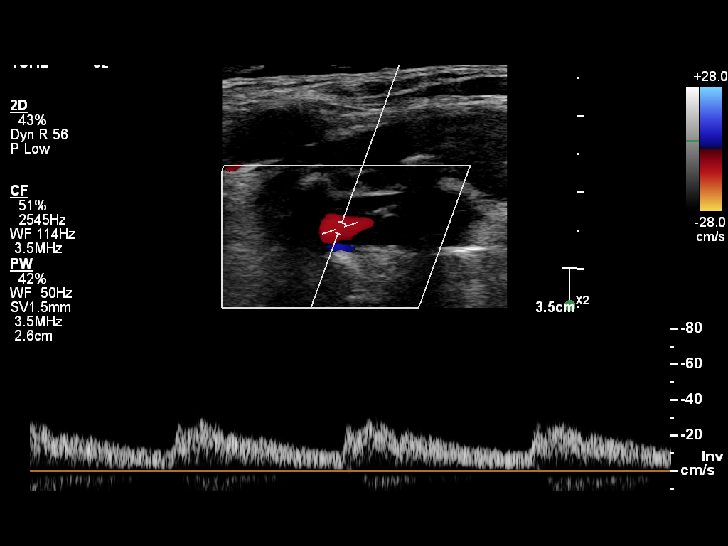

[12 of 16 positions shown; findings below may reference images not displayed]

FINDINGS: RIGHT CAROTID:                                                                            
 ICA PSV                                                                                   
 PROX (cm/s): 55 cm/s                                                                      
 MID (cm/s): 76 cm/s                                                                       
 DIST (cm/s): 70 cm/s                                                                      
 ICA EDV                                                                                   
 PROX (cm/s): 21 cm/s                                                                      
 MID (cm/s): 32 cm/s                                                                       
 DIST (cm/s): 29 cm/s                                                                      
 CCA DIST PSV (cm/s): 126                                                                  
 ICA/CCA RATIO:
 Waveforms: Normal.                                                                        
 Plaque: None.                                                                             
 RIGHT VERTEBRAL:                                                                          
 Antegrade. Normal waveform.                                                               
 LEFT CAROTID:                                                                             
 ICA PSV                                                                                   
 PROX (cm/s): 68 cm/s                                                                      
 MID (cm/s): 61 cm/s                                                                       
 DIST (cm/s): 60 cm/s                                                                      
 ICA EDV                                                                                   
 PROX (cm/s): 25 cm/s                                                                      
 MID (cm/s): 27 cm/s                                                                       
 DIST (cm/s): 29 cm/s                                                                      
 CCA DIST PSV (cm/s): 118                                                                  
 ICA/CCA RATIO:
 Waveforms: Normal.                                                                        
 Plaque: None.                                                                             
 LEFT VERTEBRAL:                                                                           
 Antegrade. Normal waveform.                                                               
 Additional: None.                                                                         
 Degree of Stenosis Estimation                                                             
 Normal/<50%: ICA PSV <125, Plaque Estimate 0-50%, ICA/CCA <2.0, ICA EDV <40               
 50-69%: ICA PSV 125-230, Plaque Estimate >50%, ICA/CCA 2.0-4.0, ICA EDV 40-100            
 70%: ICA PSV >230, Plaque Estimate >50%, ICA/CCA >4.0, ICA EDV >100                       
 Near Occlusion: Variable values.                                                          
 Total Occlusion: Absent flow.
IMPRESSION: 1.  No hemodynamically significant right carotid artery stenosis.                         
 2.  No hemodynamically significant left carotid artery stenosis.                          
 3.  Antegrade vertebral artery flow bilaterally.

## 2021-05-23 ENCOUNTER — Telehealth (INDEPENDENT_AMBULATORY_CARE_PROVIDER_SITE_OTHER): Payer: Self-pay | Admitting: Family Medicine

## 2021-09-04 ENCOUNTER — Telehealth (INDEPENDENT_AMBULATORY_CARE_PROVIDER_SITE_OTHER): Payer: Self-pay | Admitting: Family Medicine

## 2021-09-04 NOTE — Telephone Encounter (Signed)
Subpoena for request of records from [facility] was placed in [place]. Check attached for Medical records and billing. Check number [id number]      //AP.

## 2022-02-11 ENCOUNTER — Encounter (INDEPENDENT_AMBULATORY_CARE_PROVIDER_SITE_OTHER): Payer: Self-pay | Admitting: Neurology

## 2022-02-11 ENCOUNTER — Ambulatory Visit (INDEPENDENT_AMBULATORY_CARE_PROVIDER_SITE_OTHER): Admitting: Neurology

## 2022-02-11 VITALS — BP 141/86 | HR 101 | Ht 65.0 in | Wt 270.0 lb

## 2022-02-11 MED ORDER — MODAFINIL 100 MG OR TABS
50.0000 mg | ORAL_TABLET | Freq: Every day | ORAL | Status: AC
Start: 2022-01-21 — End: ?

## 2022-02-11 MED ORDER — ELETRIPTAN HYDROBROMIDE 40 MG OR TABS
40.0000 mg | ORAL_TABLET | Freq: Once | ORAL | 11 refills | Status: AC | PRN
Start: 2022-02-11 — End: ?

## 2022-02-11 NOTE — Progress Notes (Signed)
This is a neuro-ophthalmological reevaluation for a patient with a history of idiopathic intracranial hypertension initially seen by me in 2015 who responded to weight loss and treatment.  When last seen in 7/22 her nerves look stable and visual fields were normal.  I asked to recheck her in a year.  She also has a history of postconcussion syndrome with cognitive issues.  She did complete neuropsych testing which did not show any meaningful cognitive deficits.    Since last seen she reports she is doing well with about 1 migraine a month.  She is having no transient visual obscurations.  She has chronic pulsatile tinnitus for many years which has not changed.Her weight is up about 5 pounds.    She was seen at the [facility] by Dr. Jodie Echevaria on 02/04/2022.  Her examination was normal except the OCT showed some increase in nerve fiber layer thickness in the left suggesting a change in her papilledema.  In light of this she was asked to see me back.      Current Outpatient Medications:     citalopram (CELEXA) 20 MG tablet, Take 1 tablet (20 mg) by mouth daily., Disp: , Rfl:     eletriptan (RELPAX) 40 MG tablet, Take 1 tablet (40 mg) by mouth once as needed for Migraine. May repeat in 2 hours if necessary. Maximum of  2 capsules in 24 hours., Disp: 9 tablet, Rfl: 5    modafinil (PROVIGIL) 100 MG tablet, Take 0.5 tablets (50 mg) by mouth daily., Disp: , Rfl:     Allergies   Allergen Reactions    Sulfa Drugs Unspecified, Other and Swelling     CONV. REACTION:Swelling    Contrast Media [Diagnostic X-Ray Materials] Unspecified     Bp went very high     Ioversol Unspecified         Examination: Visual acuity is 20/20-2 OD and 20/30 OS which pinhole to 20/20-2   Pupils are 4 mm and brisk reactive no afferent defect   Eye movements are full    [facility] 30-2 thresher visual field test is normal bilaterally with normal blind spots and a mean deviation -0.9 OD.  The left eye still show a slight inferior nasal depression  respecting the horizontal meridian with a mean deviation of -1.7 which is completely stable from last year.    Dilated funduscopic examination shows the right nerve is nearly flat except for slight elevation superiorly.  In the left eye I agree there is some elevation superiorly it is little changed from the last few years.  I think she has some chronic mild elevation.  There is no major change.    BP 141/86 (BP Location: Left arm, BP Patient Position: Sitting, BP cuff size: Regular)   Pulse 101   Ht 5\' 5"  (1.651 m)   Wt 122.5 kg (270 lb)   BMI 44.93 kg/m       Impression: Idiopathic intracranial hypertension     This patient with longstanding idiopathic intracranial hypertension had  mild recurrence with significant weight gain.  She has remained asymptomatic and her visual fields have remained very stable and completely normal on the right with a slight inferior nasal depression in the left.  There may be slight increase in mild edema on the left but the right looks stable.  In light of this we will continue to follow her and encourage weight loss which is really the most important thing for her.  She may consider talking to  her primary about use of some of the new weight loss medications given her morbid obesity.  As long as she is stable I will recheck her in about 4 months.  She knows to call if anything is changing.      Electronically signed by: Zara Chess, MD   Signature Derived From Controlled Access Password, February 11, 2022, 2:57 PM

## 2022-02-11 NOTE — Interdisciplinary (Signed)
I, Jude Linck, roomed and reviewed all medications and allergies with patient. //tut

## 2022-02-14 ENCOUNTER — Encounter (INDEPENDENT_AMBULATORY_CARE_PROVIDER_SITE_OTHER): Admitting: Family Medicine

## 2022-02-14 DIAGNOSIS — N920 Excessive and frequent menstruation with regular cycle: Secondary | ICD-10-CM

## 2022-02-14 MED ORDER — PROGESTERONE 200 MG PO CAPS
200.00 mg | ORAL_CAPSULE | Freq: Every day | ORAL | 0 refills | Status: DC
Start: 2022-02-14 — End: 2022-03-21

## 2022-02-14 NOTE — Telephone Encounter (Signed)
Noted  

## 2022-02-14 NOTE — Telephone Encounter (Signed)
Total time spent 13 minutes. With multiple communications

## 2022-02-14 NOTE — Telephone Encounter (Signed)
This patient gave consent for this Medical Advice message and is aware that it may result in a bill to their insurance, as well as the possibility of receiving a bill for a co-pay and/or deductible. They are an established patient but are not seeking information exclusively about a problem treated during an in person or video visit in the last seven days. I do not recommend an in-person or video visit within seven days of my reply.     See the MyChart message from patient and my reply for my assessment and plan.  MyChart encounter date:  02/14/22      I spent a total of  6 minutes reviewing the patient's prior medical records and current request for medical advice, prescribing medications, or ordering tests (if applicable), replying to the patient, and documenting the encounter.

## 2022-02-14 NOTE — Telephone Encounter (Signed)
From: Autumn Parker  To: Autumn Celine Ahr, MD  Sent: 02/14/2022 11:18 AM PST  Subject: Extreme bleeding with proof    Hi Dr Estanislado Pandy,  I turned 46 this year and have begun perimenopause. I am currently having an extreme period this month. I'm on day 8 of bleeding through super plus tampons every 2 hours. I'm not sure if this is something I should keep waiting out or if I should come in to see you.     Thanks  Autumn Parker

## 2022-02-14 NOTE — Telephone Encounter (Signed)
Pt booked 1/8 OV with AK. FYI only

## 2022-03-10 ENCOUNTER — Encounter (INDEPENDENT_AMBULATORY_CARE_PROVIDER_SITE_OTHER): Payer: Self-pay | Admitting: Family Medicine

## 2022-03-10 ENCOUNTER — Ambulatory Visit (INDEPENDENT_AMBULATORY_CARE_PROVIDER_SITE_OTHER): Admitting: Family Medicine

## 2022-03-10 VITALS — BP 116/82 | HR 79 | Temp 97.5°F | Wt 270.0 lb

## 2022-03-10 NOTE — Progress Notes (Signed)
Autumn Parker is a 47 year old female had concerns including Vaginal bleeding.    SUBJECTIVE:  HPI  Autumn Parker presents to discuss recent episode of vaginal bleeding. In 01/2022, had heavy period. Bleeding through super plus tampons every 2 hours for 11 days. Prescribed progesterone daily on 02/14/22. Bleeding then slowed down, and able to use a regular tampon every 6 hours. Denies dizziness or lightheadedness. Prior to these episodes, was having mostly regular monthly periods. In 11/2021, started to have more irregular menses. One was 15 days late. Scheduled with OBGYN/Dr. Silverman on 03/24/22.    12/7: period started with very heavy bleeding  12/15: started taking progesterone   12/18: bleeding lightened up  12/21: period ended  12/25: stopped progesterone  12/28: period started again, heavy bleeding  12/30: started progesterone again   1/5: period stopped  Today: Still taking progesterone     One female partner. He had vasectomy.  Has migraines and are under good control. Usually about once a month to once a quarter.   Had a hard time in the past with OCP.       Health Maintenance  CRC: Due  Mammogram: Due  Hep C: Due  PNA: Due  Influenza: Due  PHQ    Patient Active Problem List    Diagnosis Date Noted    Asthma     Obesity     Anxiety     Hyperlipemia     Meniere's disease     Bipolar 2 disorder (CMS-HCC)     Migraine headache 01/11/2019    Benign intracranial hypertension 05/30/2015    Dry eyes 04/03/2015        Past Medical History:   Diagnosis Date    Anxiety     Asthma     Benign intracranial hypertension 05/30/2015    Bipolar 2 disorder (CMS-HCC)     Depression     bipolar Type 2 at 47 y/o    Diet controlled gestational diabetes mellitus (GDM), antepartum     GDM (gestational diabetes mellitus)     Gestational diabetes     Meniere's disease     ENT Dr. Lynann Bologna    Migraine without aura     Migraines     Obesity     Other hyperlipidemia     Postconcussion syndrome 09/04/2020       Past Surgical History:    Procedure Laterality Date    EYE SURGERY      x2 as child    PB CESAREAN DELIVERY ONLY         Current Outpatient Medications on File Prior to Visit   Medication Sig    citalopram (CELEXA) 20 MG tablet Take 1 tablet (20 mg) by mouth daily.    eletriptan (RELPAX) 40 MG tablet Take 1 tablet (40 mg) by mouth once as needed for Migraine. May repeat in 2 hours if necessary. Maximum of  2 capsules in 24 hours.    modafinil (PROVIGIL) 100 MG tablet Take 0.5 tablets (50 mg) by mouth daily.    progesterone (PROMETRIUM) 200 MG capsule Take 1 capsule (200 mg) by mouth daily.     No current facility-administered medications on file prior to visit.       Allergies as of 03/10/2022 - Verified 03/10/2022   Allergen Reaction Noted    Sulfa drugs Unspecified, Other, and Swelling 10/04/2008    Contrast media [diagnostic x-ray materials] Unspecified 04/01/2017    Ioversol Unspecified 06/07/2014  OBJECTIVE:  Vitals:    03/10/22 1631   BP: 116/82   Pulse: 79   Temp: 97.5 F (36.4 C)   Weight: 122.5 kg (270 lb)     Body mass index is 44.93 kg/m.    Physical Exam  Constitutional:       Appearance: Normal appearance.   HENT:      Head: Normocephalic and atraumatic.   Eyes:      General: No scleral icterus.  Pulmonary:      Effort: Pulmonary effort is normal.   Skin:     Coloration: Skin is not jaundiced or pale.   Neurological:      Mental Status: She is alert.   Psychiatric:         Mood and Affect: Mood normal.         Behavior: Behavior normal.         Thought Content: Thought content normal.         Assessment & Plan     Autumn Parker was seen today for vaginal bleeding.    Diagnoses and all orders for this visit:    Menometrorrhagia  -     Ultrasound Pelvic TA and TV    Likely  benign perimenopause but rec more evaluation  Seein gyn this month  Ultrasound  She has some relative contraindications to OCP so rec continue with daily progesterone since it has been effective for her      Patient Instructions:  See Patient  Education/Instructions section in Epic. Reviewed verbally and AVS available via MyChart for patient     Return if symptoms worsen or fail to improve.         Lanora Manis, M.D.  [facility] [address], [address]  [place], [place] [address]

## 2022-03-21 ENCOUNTER — Other Ambulatory Visit (INDEPENDENT_AMBULATORY_CARE_PROVIDER_SITE_OTHER): Payer: Self-pay | Admitting: Family Medicine

## 2022-03-21 DIAGNOSIS — N920 Excessive and frequent menstruation with regular cycle: Secondary | ICD-10-CM

## 2022-03-21 MED ORDER — PROGESTERONE 200 MG PO CAPS
200.0000 mg | ORAL_CAPSULE | Freq: Every day | ORAL | 0 refills | Status: DC
Start: 2022-03-21 — End: 2022-04-25

## 2022-03-21 NOTE — Telephone Encounter (Signed)
LF: 02/14/22  Med pended.   Pharm verified.

## 2022-03-21 NOTE — Telephone Encounter (Signed)
Seen 03/10/2022 med contd at visit, to see GYN 03/24/2022 per the note  The last visit note in Epic was reviewed and the medication refilled

## 2022-03-25 ENCOUNTER — Encounter (INDEPENDENT_AMBULATORY_CARE_PROVIDER_SITE_OTHER): Payer: Self-pay | Admitting: Family Medicine

## 2022-03-25 DIAGNOSIS — N839 Noninflammatory disorder of ovary, fallopian tube and broad ligament, unspecified: Secondary | ICD-10-CM

## 2022-03-25 NOTE — Result Encounter Note (Signed)
See telephone or MyChart encounter where this result was handled.

## 2022-03-25 NOTE — Telephone Encounter (Signed)
US Pelvic TA/TV ordered.

## 2022-03-25 NOTE — Telephone Encounter (Signed)
Pls order ultrasound pelvis same order    Dx ovarian lesion or abnormal imaging ovary

## 2022-03-26 ENCOUNTER — Telehealth (INDEPENDENT_AMBULATORY_CARE_PROVIDER_SITE_OTHER): Payer: Self-pay | Admitting: Family Medicine

## 2022-03-26 DIAGNOSIS — Z1159 Encounter for screening for other viral diseases: Secondary | ICD-10-CM

## 2022-03-26 DIAGNOSIS — Z1239 Encounter for other screening for malignant neoplasm of breast: Secondary | ICD-10-CM

## 2022-03-26 DIAGNOSIS — E785 Hyperlipidemia, unspecified: Secondary | ICD-10-CM

## 2022-03-26 NOTE — Telephone Encounter (Signed)
04/28/21 CPE w/ AK    Labs: ordered  Mammogram: ordered and faxed to [facility]  Colon Screening: due, discuss.   HCV Antibody: ordered    Questionnaires confirmed, orders placed, MyChart msg sent.   Closing encounter.

## 2022-04-22 ENCOUNTER — Encounter (INDEPENDENT_AMBULATORY_CARE_PROVIDER_SITE_OTHER): Payer: Self-pay | Admitting: Family Medicine

## 2022-04-23 ENCOUNTER — Telehealth (INDEPENDENT_AMBULATORY_CARE_PROVIDER_SITE_OTHER): Payer: Self-pay | Admitting: Family Medicine

## 2022-04-23 ENCOUNTER — Encounter (INDEPENDENT_AMBULATORY_CARE_PROVIDER_SITE_OTHER): Payer: Self-pay

## 2022-04-23 DIAGNOSIS — Z01812 Encounter for preprocedural laboratory examination: Secondary | ICD-10-CM

## 2022-04-23 NOTE — Progress Notes (Signed)
See attached pre op order in Environmental health practitioner

## 2022-04-23 NOTE — Telephone Encounter (Signed)
From: Autumn Canter  To: Amy Matthias Hughs, MD  Sent: 04/22/2022 7:55 PM PST  Subject: Bloodwork for upcoming physical    Hi Dr Luci Bank,  I thought I was supposed to have my blood drawn before I come in next week for a physical, but can't find the message or order for it. Did you want me to do that this week?    Also I see that I didn't reply to an earlier message. I do have another pelvic ultrasound scheduled for March 12 at [facility] at 11:30am.    Thanks,  Autumn Parker

## 2022-04-23 NOTE — Telephone Encounter (Signed)
Recv'd call from [facility] at Dr. Biagio Borg office seeking pre-op clearance for Pts hand surgery 3/7. She will fax orders to me at [phone]

## 2022-04-23 NOTE — Telephone Encounter (Signed)
Anderson Malta called to make sure u received orders, I don't see them in chart, FYI

## 2022-04-24 NOTE — Telephone Encounter (Signed)
S/w Anderson Malta.    She called again to inquire on if we received orders. Let her know we did, FYI

## 2022-04-25 ENCOUNTER — Other Ambulatory Visit (INDEPENDENT_AMBULATORY_CARE_PROVIDER_SITE_OTHER): Payer: Self-pay | Admitting: Family Medicine

## 2022-04-25 DIAGNOSIS — N920 Excessive and frequent menstruation with regular cycle: Secondary | ICD-10-CM

## 2022-04-25 NOTE — Telephone Encounter (Signed)
Noted. Postponing to f/u.

## 2022-04-25 NOTE — Telephone Encounter (Signed)
Pt would like printed RX for med, pls route to Print and Have Dr Kathrin Penner sign  Seen 03/10/2022, planned appt 04/2022, had pelvic imaging 03/2022 rvd report  Mammo due and ordered 03/2022 noted

## 2022-04-25 NOTE — Telephone Encounter (Signed)
Pt req refill for med attached, asking for a hard copy to be picked up Monday. Please cb once ready

## 2022-04-25 NOTE — Telephone Encounter (Addendum)
Per AK:   Yes,. 1 unit OV with PA     S/w Pt who sched EKG and 1U OV with AK 2/29 (cannot see PA bc of ins). She will complete fasting labs Monday morning and I let her know to mention both order dates.    SK - please hold for results to fax clearance. Surgery is 3/7

## 2022-04-25 NOTE — Telephone Encounter (Signed)
Reviewed orders from Dr. Grandville Silos who is req EKG, med clearance, and labs (CBC/CMP).    Pt has fasting labs to complete ordered 1/24 so I ordered CBC to [facility]. Will notify Pt she will need to complete all labs and book EKG.    AK - do you want to see Autumn Parker for 1U OV once testing complete?

## 2022-04-28 ENCOUNTER — Encounter (INDEPENDENT_AMBULATORY_CARE_PROVIDER_SITE_OTHER): Admitting: Family Medicine

## 2022-04-28 LAB — CBC WITH DIFF, BLOOD
Baso (Absolute): 0 10*3/uL (ref 0.0–0.2)
Basos: 1 %
Eos (Absolute): 0.1 10*3/uL (ref 0.0–0.4)
Eos: 2 %
Hematocrit: 38 % (ref 34.0–46.6)
Hemoglobin: 12.3 g/dL (ref 11.1–15.9)
Immature Grans (Abs): 0 10*3/uL (ref 0.0–0.1)
Immature Granulocytes: 0 %
Lymphs (Absolute): 1.5 10*3/uL (ref 0.7–3.1)
Lymphs: 33 %
MCH: 28.8 pg (ref 26.6–33.0)
MCHC: 32.4 g/dL (ref 31.5–35.7)
MCV: 89 fL (ref 79–97)
Monocytes(Absolute): 0.3 10*3/uL (ref 0.1–0.9)
Monocytes: 7 %
Neutrophils (Absolute): 2.5 10*3/uL (ref 1.4–7.0)
Neutrophils: 57 %
Platelets: 313 10*3/uL (ref 150–450)
RBC: 4.27 x10E6/uL (ref 3.77–5.28)
RDW: 12.3 % (ref 11.7–15.4)
WBC: 4.4 10*3/uL (ref 3.4–10.8)

## 2022-04-28 MED ORDER — PROGESTERONE 200 MG PO CAPS
200.0000 mg | ORAL_CAPSULE | Freq: Every day | ORAL | 3 refills | Status: DC
Start: 2022-04-28 — End: 2023-05-01

## 2022-04-28 NOTE — Telephone Encounter (Signed)
Rx printed and placed in AK [place] folder.

## 2022-04-29 LAB — COMPREHENSIVE METABOLIC PANEL, BLOOD
A/G Ratio: 1.6 (ref 1.2–2.2)
ALT (SGPT): 15 IU/L (ref 0–32)
AST: 15 IU/L (ref 0–40)
Albumin: 4.1 g/dL (ref 3.9–4.9)
Alkaline Phos: 63 IU/L (ref 44–121)
BUN/Creatinine Ratio: 22 (ref 9–23)
BUN: 16 mg/dL (ref 6–24)
Bilirubin, Total: 0.3 mg/dL (ref 0.0–1.2)
Calcium: 9.2 mg/dL (ref 8.7–10.2)
Carbon Dioxide: 25 mmol/L (ref 20–29)
Chloride: 101 mmol/L (ref 96–106)
Creatinine: 0.72 mg/dL (ref 0.57–1.00)
EGFR: 104 mL/min/{1.73_m2} (ref >59)
Globulin, Total: 2.5 g/dL (ref 1.5–4.5)
Glucose: 95 mg/dL (ref 70–99)
Potassium: 4.3 mmol/L (ref 3.5–5.2)
Protein, Total, Serum: 6.6 g/dL (ref 6.0–8.5)
Sodium: 138 mmol/L (ref 134–144)

## 2022-04-29 LAB — LIPID(CHOL FRACT) PANEL, BLOOD
Cholesterol: 240 mg/dL — ABNORMAL HIGH (ref 100–199)
HDL Cholesterol: 47 mg/dL (ref >39)
LDL Chol CAL (NIH) - LABCORP: 155 mg/dL — ABNORMAL HIGH (ref 0–99)
Non-HDL Cholesterol: 193 mg/dL — ABNORMAL HIGH (ref 0–129)
Triglycerides: 208 mg/dL — ABNORMAL HIGH (ref 0–149)
VLDL Cholesterol CAL: 38 mg/dL (ref 5–40)

## 2022-04-29 LAB — INTERPRETATION - LABCORP

## 2022-04-29 LAB — HEPATITIS C VIRUS (HCV) ANTIBODY WITH REFLEX TO QUANTITATIVE REAL-TIME PCR: Hepatitis C virus Ab Signal/Cutoff: NONREACTIVE

## 2022-04-30 NOTE — Telephone Encounter (Signed)
Pt walked into office to obtain Rx. AK not yet signed. JR signed form, scanned into media, and handed to Pt in person. No further action needed. Closing encounter.

## 2022-05-01 ENCOUNTER — Encounter (INDEPENDENT_AMBULATORY_CARE_PROVIDER_SITE_OTHER): Admitting: Family Medicine

## 2022-05-01 ENCOUNTER — Ambulatory Visit (INDEPENDENT_AMBULATORY_CARE_PROVIDER_SITE_OTHER)

## 2022-05-01 VITALS — Temp 97.3°F

## 2022-05-01 NOTE — Telephone Encounter (Signed)
Noted. FYI CC for tomorrow. Holding TE in my box for clearance.

## 2022-05-01 NOTE — Telephone Encounter (Signed)
Pt had to cancel pre-op OV today bc of timing but is completing EKG today and rescheduled to see CC tomorrow (cannot see PA bc of insurance).    FYI only. Please route to CC/EK for clearance so they are aware of why Pt added

## 2022-05-01 NOTE — Progress Notes (Signed)
 This encounter was opened in error.  Please disregard.

## 2022-05-01 NOTE — Telephone Encounter (Signed)
Noted  

## 2022-05-01 NOTE — Progress Notes (Deleted)
($)   ECG      Date/Time: 05/01/2022 10:47 AM    Performed by: Lamonte Sakai  Authorized by: Jeryl Columbia, MD  Interpreted by physician

## 2022-05-01 NOTE — Interdisciplinary (Signed)
Autumn Parker presents today for a pre-op EKG. She will be seeing CC tomorrow for clearance.

## 2022-05-01 NOTE — Progress Notes (Signed)
($)   ECG      Date/Time: 05/01/2022 2:42 PM    Performed by: Lamonte Sakai  Authorized by: Jeryl Columbia, MD  Interpreted by physician

## 2022-05-02 ENCOUNTER — Encounter (INDEPENDENT_AMBULATORY_CARE_PROVIDER_SITE_OTHER): Payer: Self-pay | Admitting: Family Medicine

## 2022-05-02 ENCOUNTER — Ambulatory Visit (INDEPENDENT_AMBULATORY_CARE_PROVIDER_SITE_OTHER): Admitting: Family Medicine

## 2022-05-02 VITALS — BP 122/80 | HR 76 | Temp 97.5°F | Ht 64.96 in | Wt 268.2 lb
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
## 2022-05-02 NOTE — Assessment & Plan Note (Signed)
Stable on Celexa.

## 2022-05-02 NOTE — Assessment & Plan Note (Signed)
Encouraged healthier food choices and regular exercise.

## 2022-05-05 ENCOUNTER — Telehealth (INDEPENDENT_AMBULATORY_CARE_PROVIDER_SITE_OTHER): Payer: Self-pay | Admitting: Family Medicine

## 2022-05-05 NOTE — Telephone Encounter (Signed)
Nurse from Dr. Biagio Borg office called inquiring about the status of patient's pre op clearance for CTR. They would like the clearance faxed to [phone].

## 2022-05-05 NOTE — Telephone Encounter (Signed)
Faxed clearance, facesheet, labs, EKG, and visit notes to F: [id number]. No further action needed at this time. Closing encounter.

## 2022-05-05 NOTE — Telephone Encounter (Signed)
Ok

## 2022-05-05 NOTE — Telephone Encounter (Signed)
Noted, CC please advise, OK for clearance letter? Note from 05/02/22 is signed.

## 2022-05-16 ENCOUNTER — Telehealth (INDEPENDENT_AMBULATORY_CARE_PROVIDER_SITE_OTHER): Payer: Self-pay | Admitting: Family Medicine

## 2022-05-16 NOTE — Telephone Encounter (Signed)
I have ordered Cologuard through [employer] and provided pt's insurance information. I then notified pt and advised to expect call from [employer] before the company can mail out Cologuard kit.

## 2022-05-16 NOTE — Telephone Encounter (Signed)
I called [employer] and per automated system no prior auth is required. Reference number [id number]. Please advise the pt once the records have been sent to [employer]. Thanks

## 2022-06-04 ENCOUNTER — Ambulatory Visit (INDEPENDENT_AMBULATORY_CARE_PROVIDER_SITE_OTHER): Admitting: Neurology

## 2022-06-04 ENCOUNTER — Encounter (INDEPENDENT_AMBULATORY_CARE_PROVIDER_SITE_OTHER): Payer: Self-pay | Admitting: Neurology

## 2022-06-04 ENCOUNTER — Ambulatory Visit (INDEPENDENT_AMBULATORY_CARE_PROVIDER_SITE_OTHER)

## 2022-06-04 VITALS — BP 143/82 | HR 79 | Ht 67.0 in | Wt 265.0 lb

## 2022-06-04 NOTE — Interdisciplinary (Signed)
I, Tabia Landowski, roomed and reviewed all medications and allergies with patient. //tut

## 2022-06-04 NOTE — Progress Notes (Signed)
This is a 4 month neuro-ophthalmological reevaluation for a patient with a history of idiopathic intracranial hypertension initially seen by me in 2015 who responded to weight loss and treatment.  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      Since last seen she reports she is doing well.  She has about 1 migraine a month now.  Pulsatile tinnitus is much less frequent only occurring a few times a month.  She has no transient visual obscurations.  She had recent right carpal tunnel surgery.  Her vision is good.  She is lost some further weight and is down to 265 pounds.        Current Outpatient Medications:     citalopram (CELEXA) 20 MG tablet, Take 1 tablet (20 mg) by mouth daily., Disp: , Rfl:     eletriptan (RELPAX) 40 MG tablet, Take 1 tablet (40 mg) by mouth once as needed for Migraine. May repeat in 2 hours if necessary. Maximum of  2 capsules in 24 hours., Disp: 9 tablet, Rfl: 11    modafinil (PROVIGIL) 100 MG tablet, Take 0.5 tablets (50 mg) by mouth daily., Disp: , Rfl:     progesterone (PROMETRIUM) 200 MG capsule, Take 1 capsule (200 mg) by mouth daily., Disp: 90 capsule, Rfl: 3    Allergies   Allergen Reactions    Sulfa Drugs Unspecified, Other and Swelling     CONV. REACTION:Swelling    Contrast Media [Diagnostic X-Ray Materials] Unspecified     Bp went very high     Ioversol Unspecified         Examination: Visual acuity is 20/20-1 OD and 20/20-2 OS     Pupils are 4 mm and brisk reactive no afferent defect     Eye movements are full    [place] 30-2 Thresholdvisual field test is normal bilaterally with normal blind spots and a mean deviation -0.1 OD.  The left eye shows some mild central depression with mean deviation -0.5 which is an improvement.    Dilated funduscopic examination shows the right nerve is nearly flat except for slight elevation  superiorly.  The left eye still looks quite anomalous and elevated without clear edema.    BP 143/82 (BP Location: Left arm, BP Patient Position: Sitting, BP cuff size: Regular)   Pulse 79   Ht 5\' 7"  (1.702 m)   Wt 120.2 kg (265 lb)   BMI 41.50 kg/m       Impression: Idiopathic intracranial hypertension     This patient with longstanding idiopathic intracranial hypertension had  mild recurrence with significant weight gain.  She has remained asymptomatic and her visual fields have remained very stable and completely normal on the right with a slight inferior nasal depression in the left.  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  As long she is stable we will follow-up with her in 6 months.  She knows she can call if there are any new issues.    Electronically signed by: Dub Mikes, MD   Signature Derived From Controlled Access Password, June 04, 2022, 2:57 PM

## 2022-06-16 LAB — COLOGUARD WITH REFERRAL: COLOGUARD SCREEN: NEGATIVE

## 2022-07-17 ENCOUNTER — Ambulatory Visit (INDEPENDENT_AMBULATORY_CARE_PROVIDER_SITE_OTHER): Admitting: Family Medicine

## 2022-08-24 ENCOUNTER — Encounter (INDEPENDENT_AMBULATORY_CARE_PROVIDER_SITE_OTHER): Payer: Self-pay | Admitting: Family Medicine

## 2022-08-25 NOTE — Telephone Encounter (Signed)
From: Darene Lamer  To: Amy Celine Ahr  Sent: 08/24/2022 9:53 AM PDT  Subject: Considering semaglutides    Hi Dr Estanislado Pandy  I am considering using semaglutides for weightloss. I was wondering if that is something my insurance would cover given my current weight/bmi? I've been sidelined with a knee injury and just feel like I cannot lose weight right now. Is this something you prescribe?     Thanks  French Ana

## 2022-10-14 ENCOUNTER — Encounter (INDEPENDENT_AMBULATORY_CARE_PROVIDER_SITE_OTHER): Admitting: Family Medicine

## 2022-12-02 ENCOUNTER — Telehealth (INDEPENDENT_AMBULATORY_CARE_PROVIDER_SITE_OTHER): Payer: Self-pay | Admitting: Neurology

## 2022-12-02 NOTE — Telephone Encounter (Signed)
12/02/22  Received Records request from [facility] [employer]. Placed into [employer].  RE

## 2022-12-03 ENCOUNTER — Encounter (INDEPENDENT_AMBULATORY_CARE_PROVIDER_SITE_OTHER)

## 2022-12-03 ENCOUNTER — Ambulatory Visit (INDEPENDENT_AMBULATORY_CARE_PROVIDER_SITE_OTHER): Admitting: Neurology

## 2022-12-03 ENCOUNTER — Encounter (INDEPENDENT_AMBULATORY_CARE_PROVIDER_SITE_OTHER): Payer: Self-pay | Admitting: Neurology

## 2022-12-03 VITALS — BP 121/84 | HR 80 | Ht 65.0 in | Wt 257.4 lb

## 2022-12-03 MED ORDER — MULTIVITAMIN ADULT PO: ORAL | Status: AC

## 2022-12-03 MED ORDER — CELECOXIB 100 MG OR CAPS
ORAL_CAPSULE | ORAL | Status: AC
Start: 2022-08-13 — End: ?

## 2022-12-03 MED ORDER — CETIRIZINE HCL 10 MG OR TABS: 10.00 mg | ORAL_TABLET | ORAL | Status: AC

## 2022-12-03 NOTE — Progress Notes (Signed)
This is a 6 month neuro-ophthalmological rf/u  for a patient with a history of idiopathic intracranial hypertension initially seen by me in 2015 who responded to weight loss and treatment.  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  She has a small inferior nasal step that has been present for a long time but not noted on the last field.  She has migraines occasionally with menses.  She started a GLP-1 which is compounded and lost 18 pounds.  Her pulsatile tinnitus is gone.  She has no transient visual obscurations. She is lost some further weight and is down to 257 pounds.        Current Outpatient Medications:     citalopram (CELEXA) 20 MG tablet, Take 1 tablet (20 mg) by mouth daily., Disp: , Rfl:     eletriptan (RELPAX) 40 MG tablet, Take 1 tablet (40 mg) by mouth once as needed for Migraine. May repeat in 2 hours if necessary. Maximum of  2 capsules in 24 hours., Disp: 9 tablet, Rfl: 11    modafinil (PROVIGIL) 100 MG tablet, Take 0.5 tablets (50 mg) by mouth daily., Disp: , Rfl:     progesterone (PROMETRIUM) 200 MG capsule, Take 1 capsule (200 mg) by mouth daily., Disp: 90 capsule, Rfl: 3    Allergies   Allergen Reactions    Sulfa Drugs Unspecified, Other and Swelling     CONV. REACTION:Swelling    Contrast Media [Diagnostic X-Ray Materials] Unspecified     Bp went very high     Ioversol Unspecified         Examination: Visual acuity is 20/15-2 OD and 20/20-2 OS with contacts    The right pupil is 4.4 mm left is 4 and there is no change in anisocoria and darkness.  There are brisk reactive but no relative afferent pupillary defect.    Eye movements are full    [place] 30-2 threshold visual field test Is normal on the right with a mean deviation of -0.1 Which is unchanged and completely normal.  The left eye shows a normal blind spot and  inferior nasal step with mediation and is 1.6 to which is relatively stable although slightly worse than the last visit.    Dilated funduscopic examination shows the right nerve is nearly flat except for slight elevation superiorly.  The left eye still looks quite anomalous and elevated without clear edema.This is pretty much exactly as I saw her last time.    BP 121/84 (BP Location: Left arm, BP Patient Position: Sitting, BP cuff size: Large)   Pulse 80   Ht 5\' 5"  (1.651 m)   Wt 116.8 kg (257 lb 6.4 oz)   BMI 42.83 kg/m         Impression: Idiopathic intracranial hypertension     This patient with longstanding idiopathic intracranial hypertension had  mild recurrence with significant weight gain. She is now on a GLP-1 and has been losing weight so should be doing well.  I will see her back in 4 months and if she is good at that time we will make it 6 months.  She has sleep apnea and wants a sleep test ordered.  Evidently this was requested by her primary.  I will go ahead and do that.    Sleep test overnight  Follow-up 4  months with vision field      Electronically signed by: Zara Chess, MD   Signature Derived From Controlled Access Password, December 03, 2022, 2:57 PM

## 2022-12-03 NOTE — Interdisciplinary (Signed)
I, Jade McDermott, roomed and reviewed all medications and allergies with patient. //jm

## 2022-12-22 ENCOUNTER — Telehealth (INDEPENDENT_AMBULATORY_CARE_PROVIDER_SITE_OTHER): Payer: Self-pay | Admitting: Family Medicine

## 2022-12-22 ENCOUNTER — Telehealth (INDEPENDENT_AMBULATORY_CARE_PROVIDER_SITE_OTHER): Payer: Self-pay | Admitting: Neurology

## 2022-12-22 NOTE — Telephone Encounter (Signed)
Disregard

## 2022-12-22 NOTE — Telephone Encounter (Signed)
Dr. Oswaldo Milian,     Member ID: [id number]   Case Number: [id number]     Sleep Study was denied due to repeat study not being medically necessary. A peer to peer can be done by calling [phone] . Please advise when complete with the auth number, valid dates and facility approved to if applicable.   The referral will stay open for 5 business days, after the fifth business day the referral will be closed. You are able to complete peer to peer still after it is closed in Epic. If you decide to complete peer to peer, please route message to the Referral Coordinator so we can process the referral.      Thank you,    Aly - Referrals

## 2022-12-22 NOTE — Telephone Encounter (Signed)
Dr. Fara Olden,      Member ID: [id number]   Case Number: [id number]      Sleep Study was denied due to repeat study not being medically necessary. A peer to peer can be done by calling [phone] . Please advise when complete with the auth number, valid dates and facility approved to if applicable.   The referral will stay open for 5 business days, after the fifth business day the referral will be closed. You are able to complete peer to peer still after it is closed in Epic. If you decide to complete peer to peer, please route message to the Referral Coordinator so we can process the referral.      Thank you,    Aly - Referrals

## 2022-12-26 NOTE — Telephone Encounter (Signed)
S/w pt to relay message below. Pt stated she was already informed of this. Pt has no further questions at this time.  //tut

## 2023-04-21 NOTE — Telephone Encounter (Signed)
 2/18 sw the patient and she had a switch in her insurance so she was unable to reach out to her new pcp to place new sleep study referral. She states she's going to bring it up to dr Fara Olden tomorrow to advise if ok to place a new order so we can request auth w new ins//da

## 2023-04-22 ENCOUNTER — Encounter (INDEPENDENT_AMBULATORY_CARE_PROVIDER_SITE_OTHER): Payer: Self-pay | Admitting: Neurology

## 2023-04-22 ENCOUNTER — Ambulatory Visit (INDEPENDENT_AMBULATORY_CARE_PROVIDER_SITE_OTHER)

## 2023-04-22 ENCOUNTER — Ambulatory Visit (INDEPENDENT_AMBULATORY_CARE_PROVIDER_SITE_OTHER): Admitting: Neurology

## 2023-04-22 VITALS — BP 141/91 | HR 89 | Ht 66.0 in | Wt 249.2 lb

## 2023-04-22 NOTE — Progress Notes (Signed)
 This is a 4 month neuro-ophthalmological f/u  for a patient with a history of idiopathic intracranial hypertension initially seen by me in 2015 who responded to weight loss and treatment.  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  She continues on a GLP-1 and has lost 30 pounds total but it has stabilized.Her sleep consultation was never approved but she has new insurance.      Current Outpatient Medications:     celecoxib (CELEBREX) 100 MG capsule, TAKE ONE CAPSULE BY MOUTH TWICE DAILY AS NEEDED, Disp: , Rfl:     cetirizine (ZYRTEC) 10 MG tablet, Take 1 tablet (10 mg) by mouth., Disp: , Rfl:     citalopram (CELEXA) 20 MG tablet, Take 1 tablet (20 mg) by mouth daily., Disp: , Rfl:     eletriptan (RELPAX) 40 MG tablet, Take 1 tablet (40 mg) by mouth once as needed for Migraine. May repeat in 2 hours if necessary. Maximum of  2 capsules in 24 hours., Disp: 9 tablet, Rfl: 11    modafinil (PROVIGIL) 100 MG tablet, Take 0.5 tablets (50 mg) by mouth daily., Disp: , Rfl:     Multiple Vitamin (MULTIVITAMIN ADULT PO), , Disp: , Rfl:     progesterone (PROMETRIUM) 200 MG capsule, Take 1 capsule (200 mg) by mouth daily., Disp: 90 capsule, Rfl: 3    Allergies   Allergen Reactions    Sulfa Drugs Unspecified, Other and Swelling     CONV. REACTION:Swelling    Contrast Media [Diagnostic X-Ray Materials] Unspecified     Bp went very high     Ioversol Unspecified         Examination: Visual acuity is 20/15-2 OD and 20/20-2 OS with contacts    The right pupil is 4.7 mm left is 4 and there is no change in anisocoria and darkness.  There are brisk reactive but no relative afferent pupillary defect.    Eye movements are full    [facility] 30-2 threshold visual field test is normal on the right with a mean deviation of -2.0 which is essentially unchanged and completely  normal.  The left eye shows a normal blind spot and inferior nasal step with mediation and is 0.0 to which Is slightly improved.    Dilated funduscopic examination shows the right nerve is nearly flat except for slight elevation superiorly.  The left eye still looks quite anomalous and elevated without clear edema.This is pretty much exactly as I saw her last time.    BP (!) 141/91 (BP Location: Left arm, BP Patient Position: Sitting, BP cuff size: Regular)   Pulse 89   Ht 5\' 6"  (1.676 m)   Wt 113 kg (249 lb 3.2 oz)   BMI 40.22 kg/m             Impression: Idiopathic intracranial hypertension     This patient with longstanding idiopathic intracranial hypertension had  mild recurrence with significant weight gain. She is lost weight and is doing well.  She is still awaiting sleep medicine consultation.  I will see her back in 6 months.    Sleep test overnight  Follow-up 6 months with vision field      Electronically signed by: Zara Chess, MD   Signature Derived From Controlled Access Password, April 22, 2023, 2:57 PM

## 2023-04-22 NOTE — Interdisciplinary (Signed)
 I, Lamark Schue, roomed and reviewed all medications and allergies with patient. Autumn Parker

## 2023-05-01 ENCOUNTER — Other Ambulatory Visit (INDEPENDENT_AMBULATORY_CARE_PROVIDER_SITE_OTHER): Payer: Self-pay | Admitting: Family Medicine

## 2023-05-01 DIAGNOSIS — N920 Excessive and frequent menstruation with regular cycle: Secondary | ICD-10-CM

## 2023-05-01 MED ORDER — PROGESTERONE 200 MG PO CAPS
200.0000 mg | ORAL_CAPSULE | Freq: Every day | ORAL | 0 refills | Status: AC
Start: 2023-05-01 — End: ?

## 2023-05-01 NOTE — Telephone Encounter (Signed)
Pharm verified.  Med ordered.

## 2023-05-01 NOTE — Telephone Encounter (Signed)
 Seen 05/02/2022 for HME/preop With labs reviewed at visit  Mammo and Pap uTD  Appt due next RF  The last visit note in Epic was reviewed and the medication refilled

## 2023-07-24 ENCOUNTER — Other Ambulatory Visit (INDEPENDENT_AMBULATORY_CARE_PROVIDER_SITE_OTHER): Payer: Self-pay | Admitting: Family Medicine

## 2023-07-24 DIAGNOSIS — N920 Excessive and frequent menstruation with regular cycle: Secondary | ICD-10-CM

## 2023-07-24 DIAGNOSIS — Z1239 Encounter for other screening for malignant neoplasm of breast: Secondary | ICD-10-CM

## 2023-07-24 MED ORDER — PROGESTERONE 200 MG PO CAPS
200.0000 mg | ORAL_CAPSULE | Freq: Every day | ORAL | 0 refills | Status: DC
Start: 2023-07-24 — End: 2023-11-09

## 2023-07-24 NOTE — Telephone Encounter (Signed)
 Pls contact pt for med review/ fu appt due, Pap is UTD  Pls order annual Mammogram as just now due this month

## 2023-07-24 NOTE — Telephone Encounter (Signed)
 Called patient who sched CPE w/ [place] 10/20 and mammo orders to be done at [facility], copy of order mailed to home address. Closing TE

## 2023-07-24 NOTE — Telephone Encounter (Signed)
 Seen 05/02/2022 for HME/preop With labs reviewed at visit  Mammo and Pap uTD  Appt due   The last visit note in Epic was reviewed and the medication refilled

## 2023-07-24 NOTE — Telephone Encounter (Signed)
 Med added  Pharmacy verified

## 2023-10-21 ENCOUNTER — Ambulatory Visit (INDEPENDENT_AMBULATORY_CARE_PROVIDER_SITE_OTHER)

## 2023-10-21 ENCOUNTER — Ambulatory Visit (INDEPENDENT_AMBULATORY_CARE_PROVIDER_SITE_OTHER): Admitting: Neurology

## 2023-10-21 VITALS — BP 104/75 | HR 64 | Ht 67.0 in | Wt 236.8 lb

## 2023-10-21 NOTE — Interdisciplinary (Signed)
 During today's appointment, I, Stacie Knutzen, did the following:                  Collected vital signs (BP, pulse, weight, and height) and disinfected the blood pressure cuff and vital station                Reviewed medications ensuring that the patient tells me the dosage and frequency. If the patient does not know, then the EMR should indicate "unknown" status               Reviewed allergies                Confirmed preferred pharmacy and laboratory                Confirmed if they use any alcohol or tobacco products                Confirm if they had any falls in the past 6 months                Confirm if they had any hospitalizations (ER, urgent care, or surgical operations) in the past 3 months                Confirmed chief complaint for today's visit                Confirmed patient signed their consent form(s) if they are having a procedure (If applicable)      // MNC

## 2023-10-21 NOTE — Progress Notes (Signed)
 This is a 6 month neuro-ophthalmological f/u  for a patient with a history of idiopathic intracranial hypertension initially seen by me in 2015 who responded to weight loss and treatment.  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  She continues on a GLP-1 and has lost 40 pounds total but it has stabilized.She is still awaiting a sleep consultation.  She is now on tirzepatide generic and is taking about 5 mg a week.  She continues to lose weight slowly.  She denies any headaches, tinnitus, or visual obscurations.  She feels pretty good overall.She has migraines once or twice a month relieved with Relpax .  Usually with menses or weather change.    Patient developed supraventricular tachycardia and is now on Inderal 60 mg a day.    Current Outpatient Medications:     celecoxib  (CELEBREX ) 100 MG capsule, TAKE ONE CAPSULE BY MOUTH TWICE DAILY AS NEEDED, Disp: , Rfl:     cetirizine  (ZYRTEC ) 10 MG tablet, Take 1 tablet (10 mg) by mouth., Disp: , Rfl:     citalopram  (CELEXA ) 20 MG tablet, Take 1 tablet (20 mg) by mouth daily., Disp: , Rfl:     eletriptan  (RELPAX ) 40 MG tablet, Take 1 tablet (40 mg) by mouth once as needed for Migraine. May repeat in 2 hours if necessary. Maximum of  2 capsules in 24 hours., Disp: 9 tablet, Rfl: 11    modafinil  (PROVIGIL ) 100 MG tablet, Take 0.5 tablets (50 mg) by mouth daily., Disp: , Rfl:     Multiple Vitamin (MULTIVITAMIN ADULT PO), , Disp: , Rfl:     progesterone  (PROMETRIUM ) 200 MG capsule, Take 1 capsule (200 mg) by mouth daily., Disp: 90 capsule, Rfl: 0    propranolol (INDERAL LA) 60 MG CP24, Take 1 capsule (60 mg) by mouth daily., Disp: , Rfl:     tirzepatide-Weight Management (ZEPBOUND) 2.5 MG/0.5ML injection, Inject 0.5 mL (2.5 mg) under the skin every 7 days., Disp: , Rfl:     Allergies   Allergen Reactions    Sulfa  Drugs Unspecified, Other and Swelling     CONV. REACTION:Swelling    Contrast Media [Unknown Imaging Contrast] Unspecified     Bp went very high     Ioversol Unspecified         Examination: Visual acuity is 20/20+ OD and 20/20- OS with contacts    The right pupil is 4.7 mm left is 4 and there is no change in anisocoria and darkness.  There are brisk reactive but no relative afferent pupillary defect.    Eye movements are full    [facility] 30-2 threshold visual field test is normal on the right with a mean deviation of +1.3 which is an improvement.  The left eye is also normal with some minimal inferior nasal depression and a mean deviation -0.61 which is stable.which is essentially unchanged and completely normal. Blind spots are normal.        Dilated funduscopic examination shows the right nerve is nearly flat except for slight elevation superiorly.  The left eye still looks quite anomalous and elevated without clear edema.This is pretty much exactly as I saw her last time.    BP 104/75 (BP Location: Left arm, BP Patient Position: Sitting, BP cuff size: Regular)   Pulse 64   Ht 5' 7 (1.702  m)   Wt 107.4 kg (236 lb 12.8 oz)   BMI 37.09 kg/m     OCT of the optic nerve shows slightly elevated neuroretinal rim thickness bilaterally with nerve fiber layer thickness normal at 96 in both eyes.    Fundus photos given excellent picture of the optic nerves.  The right is flat and the left remains elevated consistent with very mild papilledema.  That left nerve does look that way for years and is unchanged.        Impression: Idiopathic intracranial hypertension     This patient with longstanding idiopathic intracranial hypertension Who had recurrence when she gained weight.  She has now lost 40 pounds and is asymptomatic.  Her left nerve remains elevated.  It is unclear to me how much of this is anomalous or very mild swelling, but given the normal and stable fields, and absence of symptoms I think continued  weight loss is the best treatment.  She will call if she is having issues otherwise I will see her back in 6 months with a vision field and fundus photos.She will pursue the sleep testing.  She has an appointment at [facility].      Follow-up 6 months with vision field      Electronically signed by: Morene Oneil Leavens, MD   Signature Derived From Controlled Access Password, October 21, 2023, 2:57 PM

## 2023-10-30 ENCOUNTER — Ambulatory Visit (INDEPENDENT_AMBULATORY_CARE_PROVIDER_SITE_OTHER)

## 2023-10-30 ENCOUNTER — Encounter (INDEPENDENT_AMBULATORY_CARE_PROVIDER_SITE_OTHER): Payer: Self-pay

## 2023-10-30 VITALS — BP 92/60 | HR 66 | Temp 97.9°F | Wt 240.8 lb

## 2023-10-30 NOTE — Progress Notes (Signed)
 Autumn Parker is a 48 year old female had concerns including Mouth/Lip Problem.    SUBJECTIVE:  HPI      Autumn Parker presents for a 4 cold sores on the inside of lower lip, upper lip with associated tingling, burning, itching x 9 days. Noticed small pustules forming lips and sores on the inside of lips. Unable to make it through day without reapplying a lot of chapstick. Has applied Abreva 3 times a day. Reports she is under extreme amount of stress. Has a few swollen tastebuds on her tongue as well. Has a history of cold sores on the outside of lips that she has taken valtrex for with relief. Current sores on the inside of her lips has resolved.       Allergies as of 10/30/2023 - Verified 10/30/2023   Allergen Reaction Noted    Sulfa drugs Unspecified, Other, and Swelling 10/04/2008    Contrast media [unknown imaging contrast] Unspecified 04/01/2017    Ioversol Unspecified 06/07/2014     Current Outpatient Medications on File Prior to Visit   Medication Sig    celecoxib  (CELEBREX ) 100 MG capsule TAKE ONE CAPSULE BY MOUTH TWICE DAILY AS NEEDED (Patient not taking: Reported on 10/30/2023)    cetirizine  (ZYRTEC ) 10 MG tablet Take 1 tablet (10 mg) by mouth.    citalopram  (CELEXA ) 20 MG tablet Take 1 tablet (20 mg) by mouth daily.    eletriptan  (RELPAX ) 40 MG tablet Take 1 tablet (40 mg) by mouth once as needed for Migraine. May repeat in 2 hours if necessary. Maximum of  2 capsules in 24 hours.    modafinil  (PROVIGIL ) 100 MG tablet Take 0.5 tablets (50 mg) by mouth daily.    Multiple Vitamin (MULTIVITAMIN ADULT PO)     progesterone  (PROMETRIUM ) 200 MG capsule Take 1 capsule (200 mg) by mouth daily.    propranolol (INDERAL LA) 60 MG CP24 Take 1 capsule (60 mg) by mouth daily.    tirzepatide-Weight Management (ZEPBOUND) 2.5 MG/0.5ML injection Inject 0.5 mL (2.5 mg) under the skin every 7 days.     No current facility-administered medications on file prior to visit.     Social History     Occupational History     Occupation: Facilities manager [employer]   Tobacco Use    Smoking status: Former    Smokeless tobacco: Never   Substance and Sexual Activity    Alcohol use: Yes     Alcohol/week: 2.0 standard drinks of alcohol     Types: 2 Drinks containing 0.5 oz of alcohol per week     Comment: 1-2 drinks/week    Drug use: Not on file    Sexual activity: Not on file      OBJECTIVE:  Vitals:    10/30/23 1406   BP: 92/60   Pulse: 66   Temp: 97.9 F (36.6 C)   Weight: 109.2 kg (240 lb 12.8 oz)         10/30/2023     2:06 PM 10/21/2023     9:47 AM 04/22/2023    10:25 AM 12/03/2022     9:47 AM 06/04/2022     9:56 AM 05/02/2022     1:51 PM   Date Weight Recorded   Metric 109.226 kg 107.412 kg 113.036 kg 116.756 kg 120.203 kg 121.655 kg   Pounds/Ounces 240 lb 12.8 oz 236 lb 12.8 oz 249 lb 3.2 oz 257 lb 6.4 oz 265 lb 268 lb 3.2 oz     Physical Exam  Constitutional:       General: She is not in acute distress.     Appearance: Normal appearance.   HENT:      Mouth/Throat:      Lips: Pink. No lesions.      Mouth: Mucous membranes are moist. No injury or oral lesions.      Tongue: No lesions.      Pharynx: No oropharyngeal exudate or posterior oropharyngeal erythema.   Eyes:      General: No scleral icterus.     Conjunctiva/sclera: Conjunctivae normal.   Pulmonary:      Effort: No respiratory distress.   Skin:     General: Skin is dry.   Neurological:      General: No focal deficit present.      Mental Status: She is alert and oriented to person, place, and time.   Psychiatric:         Mood and Affect: Mood normal.         Behavior: Behavior normal.         Assessment & Plan   Nanea was seen today for mouth/lip problem.    Diagnoses and all orders for this visit:    1. Lesion of mouth    Not symptomatic today. Based on history, sores were likely aphthous ulcers that have now resolved. If symptoms reoccur, take pictures and return to office for evaluation.     There are no discontinued medications.   Patient Instructions:  See Patient  Education/Instructions section in Epic. Reviewed verbally and AVS available via MyChart for patient     Return if symptoms worsen or fail to improve.          Herma Crawley, PA-C    [facility]  [address], [address]  [place], [place] [address]    Signature Derived From Controlled Access Password, 10/30/2023, 3:21 PM

## 2023-11-09 ENCOUNTER — Other Ambulatory Visit (INDEPENDENT_AMBULATORY_CARE_PROVIDER_SITE_OTHER): Payer: Self-pay | Admitting: Family Medicine

## 2023-11-09 DIAGNOSIS — N920 Excessive and frequent menstruation with regular cycle: Secondary | ICD-10-CM

## 2023-11-09 MED ORDER — PROGESTERONE 200 MG PO CAPS: 200.0000 mg | ORAL_CAPSULE | Freq: Every day | ORAL | 0 refills | Status: DC

## 2023-11-09 NOTE — Telephone Encounter (Signed)
 Planned appt 12/2023 for CPE  Last seen 10/30/2023, rvd also note 05/02/2022 for HME/preop With labs reviewed at visit   Mammo 10/2023, Pap UTD  The last visit note in Epic was reviewed and the medication refilled

## 2023-11-09 NOTE — Telephone Encounter (Signed)
 Med added  Pharm verified

## 2023-11-13 ENCOUNTER — Encounter (INDEPENDENT_AMBULATORY_CARE_PROVIDER_SITE_OTHER): Payer: Self-pay | Admitting: Hospital

## 2023-11-19 ENCOUNTER — Telehealth (INDEPENDENT_AMBULATORY_CARE_PROVIDER_SITE_OTHER): Payer: Self-pay | Admitting: Family Medicine

## 2023-11-19 DIAGNOSIS — G932 Benign intracranial hypertension: Secondary | ICD-10-CM

## 2023-11-19 DIAGNOSIS — E782 Mixed hyperlipidemia: Secondary | ICD-10-CM

## 2023-11-19 NOTE — Telephone Encounter (Signed)
 12/21/23 10:15  Labs: Ordered.   Mammogram: Up to date.   Colon Screening: Up to date.   HCV Antibody: Completed.     Questionnaires confirmed, orders placed, MyChart msg sent.   Closing encounter.

## 2023-12-21 ENCOUNTER — Telehealth (INDEPENDENT_AMBULATORY_CARE_PROVIDER_SITE_OTHER): Payer: Self-pay | Admitting: Family Medicine

## 2023-12-21 ENCOUNTER — Encounter (INDEPENDENT_AMBULATORY_CARE_PROVIDER_SITE_OTHER): Payer: Self-pay | Admitting: Family Medicine

## 2023-12-21 ENCOUNTER — Ambulatory Visit (INDEPENDENT_AMBULATORY_CARE_PROVIDER_SITE_OTHER): Admitting: Family Medicine

## 2023-12-21 VITALS — BP 126/80 | HR 87 | Temp 98.3°F | Ht 65.16 in | Wt 230.8 lb

## 2023-12-21 DIAGNOSIS — S39012A Strain of muscle, fascia and tendon of lower back, initial encounter: Secondary | ICD-10-CM

## 2023-12-21 MED ORDER — CYCLOBENZAPRINE HCL 10 MG OR TABS
10.0000 mg | ORAL_TABLET | Freq: Every evening | ORAL | 0 refills | Status: AC | PRN
Start: 2023-12-21 — End: ?
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
## 2023-12-21 NOTE — Telephone Encounter (Signed)
 Per AK, ref to PT sent to [facility] f#[phone]. Pt notified via [facility].

## 2023-12-21 NOTE — Patient Instructions (Signed)
-   Complete blood testing as planned    - Flexeril as needed for muscle spasm    - PT - [facility]      Zepbound 2.5mg . I have sent it to the pharmacy and then the pharmacy will request a prior authorization form us . You will receive a response from us  (over MyChart) about the outcome of the prior authorization.     If you are able to obtain the medication at a reasonable cost, please send me a message, so I may guide you on next steps and a follow up visit.    SIDE EFFECTS  Many patients experience gastrointestinal side effects when initiating semaglutide or tirzepatide. These side effects often improve or subside completely with time. Some patients have difficulty with side effects longterm. We recommend the following strategies to help with gastrointestinal symptoms:    - Plan for smaller meals to reduce nausea after meals. You will not be able to eat as much, and doing so usually increases symptom.    - Don't fast for prolonged periods or skip meals as that usually increases nausea. 3-5 small meals daily is best.    - If constipation arises, act quickly and start Miralax or Metamucil/psyllium husk. Consider starting at the outset if you prone to constipation.    - If your nausea is severe, we can offer prescription medication to help for short-term, while your body is getting used to the medication    4. Add regular strength training. This is very important to mitigate muscle loss when taking GLP1RA medications.    5. Include plenty of vegetables and protein in your diet. Protein goals: 1.5g/kg ideal body weight

## 2023-12-31 ENCOUNTER — Encounter (INDEPENDENT_AMBULATORY_CARE_PROVIDER_SITE_OTHER): Payer: Self-pay

## 2023-12-31 NOTE — Progress Notes (Signed)
Please print for provider's signature.

## 2024-01-06 NOTE — Progress Notes (Signed)
 Printed and placed in AK's [place] folder for review and signature.

## 2024-01-20 ENCOUNTER — Encounter (INDEPENDENT_AMBULATORY_CARE_PROVIDER_SITE_OTHER): Payer: Self-pay | Admitting: Hospital

## 2024-01-20 NOTE — Progress Notes (Signed)
 Scanned into careers information officer and faxed to [facility]. Fax #: [phone]

## 2024-01-24 ENCOUNTER — Encounter (INDEPENDENT_AMBULATORY_CARE_PROVIDER_SITE_OTHER): Payer: Self-pay | Admitting: Family Medicine

## 2024-02-02 LAB — RANDOM URINE MICROALB/CREAT RATIO PANEL
Creatinine, Urine: 261.6 mg/dL
Microalb/Creat Ratio: 5 mg/g{creat} (ref 0–29)
Microalbumin, Urine: 13.9 ug/mL

## 2024-02-02 LAB — COMPREHENSIVE METABOLIC PANEL, BLOOD
ALT (SGPT): 10 IU/L (ref 0–32)
AST: 16 IU/L (ref 0–40)
Albumin: 4.3 g/dL (ref 3.9–4.9)
Alkaline Phos: 53 IU/L (ref 41–116)
BUN/Creatinine Ratio: 13 (ref 9–23)
BUN: 10 mg/dL (ref 6–24)
Bilirubin, Total: 0.5 mg/dL (ref 0.0–1.2)
Calcium: 9 mg/dL (ref 8.7–10.2)
Carbon Dioxide: 26 mmol/L (ref 20–29)
Chloride: 101 mmol/L (ref 96–106)
Creatinine: 0.78 mg/dL (ref 0.57–1.00)
EGFR: 94 mL/min/1.73 (ref >59)
Globulin, Total: 2 g/dL (ref 1.5–4.5)
Glucose: 87 mg/dL (ref 70–99)
Potassium: 4.2 mmol/L (ref 3.5–5.2)
Protein, Total, Serum: 6.3 g/dL (ref 6.0–8.5)
Sodium: 137 mmol/L (ref 134–144)

## 2024-02-02 LAB — CBC WITH DIFF, BLOOD
Baso (Absolute): 0 x10E3/uL (ref 0.0–0.2)
Basos: 0 %
Eos (Absolute): 0.1 x10E3/uL (ref 0.0–0.4)
Eos: 2 %
Hematocrit: 40.4 % (ref 34.0–46.6)
Hemoglobin: 13.3 g/dL (ref 11.1–15.9)
Immature Grans (Abs): 0 x10E3/uL (ref 0.0–0.1)
Immature Granulocytes: 0 %
Lymphs (Absolute): 1.8 x10E3/uL (ref 0.7–3.1)
Lymphs: 29 %
MCH: 30.9 pg (ref 26.6–33.0)
MCHC: 32.9 g/dL (ref 31.5–35.7)
MCV: 94 fL (ref 79–97)
Monocytes(Absolute): 0.5 x10E3/uL (ref 0.1–0.9)
Monocytes: 8 %
Neutrophils (Absolute): 3.7 x10E3/uL (ref 1.4–7.0)
Neutrophils: 61 %
Platelets: 265 x10E3/uL (ref 150–450)
RBC: 4.3 x10E6/uL (ref 3.77–5.28)
RDW: 12.5 % (ref 11.7–15.4)
WBC: 6 x10E3/uL (ref 3.4–10.8)

## 2024-02-02 LAB — LIPID(CHOL FRACT) PANEL, BLOOD
Cholesterol: 170 mg/dL (ref 100–199)
HDL Cholesterol: 52 mg/dL (ref >39)
LDL Chol CAL (NIH) - LABCORP: 91 mg/dL (ref 0–99)
Non-HDL Cholesterol: 118 mg/dL (ref 0–129)
Triglycerides: 154 mg/dL — ABNORMAL HIGH (ref 0–149)
VLDL Cholesterol CAL: 27 mg/dL (ref 5–40)

## 2024-02-03 ENCOUNTER — Ambulatory Visit (INDEPENDENT_AMBULATORY_CARE_PROVIDER_SITE_OTHER): Payer: Self-pay | Admitting: Physician Assistant

## 2024-02-26 ENCOUNTER — Ambulatory Visit (INDEPENDENT_AMBULATORY_CARE_PROVIDER_SITE_OTHER)

## 2024-03-01 ENCOUNTER — Other Ambulatory Visit (INDEPENDENT_AMBULATORY_CARE_PROVIDER_SITE_OTHER): Payer: Self-pay | Admitting: Physician Assistant

## 2024-03-01 DIAGNOSIS — N920 Excessive and frequent menstruation with regular cycle: Secondary | ICD-10-CM

## 2024-03-01 MED ORDER — PROGESTERONE 200 MG PO CAPS: 200.0000 mg | ORAL_CAPSULE | Freq: Every day | ORAL | 3 refills | Status: AC

## 2024-03-01 NOTE — Telephone Encounter (Signed)
 Med added  Pharmacy verified

## 2024-03-01 NOTE — Telephone Encounter (Signed)
 Seen 12/21/2023 wellness  02/2024 labs, Mammo and Pap UTD  The last visit note in Epic was reviewed and the medication refilled
